# Patient Record
Sex: Male | Born: 1995 | Race: Black or African American | Hispanic: No | Marital: Single | Smoking: Current some day smoker
Health system: Southern US, Community
[De-identification: ages and names within clinical notes are randomized; demographics above are authoritative.]

## PROBLEM LIST (undated history)

## (undated) DIAGNOSIS — T7840XA Allergy, unspecified, initial encounter: Secondary | ICD-10-CM

## (undated) DIAGNOSIS — J45909 Unspecified asthma, uncomplicated: Secondary | ICD-10-CM

## (undated) HISTORY — DX: Unspecified asthma, uncomplicated: J45.909

## (undated) HISTORY — DX: Allergy, unspecified, initial encounter: T78.40XA

---
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
## 2015-10-20 ENCOUNTER — Ambulatory Visit (INDEPENDENT_AMBULATORY_CARE_PROVIDER_SITE_OTHER): Payer: Self-pay | Admitting: Urgent Care

## 2015-10-20 VITALS — BP 110/76 | HR 69 | Temp 97.6°F | Resp 16 | Ht 74.0 in | Wt 148.0 lb

## 2015-10-20 DIAGNOSIS — S41111A Laceration without foreign body of right upper arm, initial encounter: Secondary | ICD-10-CM

## 2015-10-20 DIAGNOSIS — F17200 Nicotine dependence, unspecified, uncomplicated: Secondary | ICD-10-CM

## 2015-10-20 DIAGNOSIS — Z23 Encounter for immunization: Secondary | ICD-10-CM

## 2015-10-20 MED ORDER — ALPRAZOLAM 0.25 MG PO TABS: 0.5000 mg | ORAL_TABLET | Freq: Once | ORAL | Status: AC

## 2015-10-20 NOTE — Patient Instructions (Addendum)
Laceration Care, Adult A laceration is a cut that goes through all of the layers of the skin and into the tissue that is right under the skin. Some lacerations heal on their own. Others need to be closed with stitches (sutures), staples, skin adhesive strips, or skin glue. Proper laceration care minimizes the risk of infection and helps the laceration to heal better. HOW TO CARE FOR YOUR LACERATION If sutures or staples were used:  Keep the wound clean and dry.  If you were given a bandage (dressing), you should change it at least one time per day or as told by your health care provider. You should also change it if it becomes wet or dirty.  Keep the wound completely dry for the first 24 hours or as told by your health care provider. After that time, you may shower or bathe. However, make sure that the wound is not soaked in water until after the sutures or staples have been removed.  Clean the wound one time each day or as told by your health care provider:  Wash the wound with soap and water.  Rinse the wound with water to remove all soap.  Pat the wound dry with a clean towel. Do not rub the wound.  After cleaning the wound, apply a thin layer of antibiotic ointmentas told by your health care provider. This will help to prevent infection and keep the dressing from sticking to the wound.  Have the sutures or staples removed as told by your health care provider. If skin adhesive strips were used:  Keep the wound clean and dry.  If you were given a bandage (dressing), you should change it at least one time per day or as told by your health care provider. You should also change it if it becomes dirty or wet.  Do not get the skin adhesive strips wet. You may shower or bathe, but be careful to keep the wound dry.  If the wound gets wet, pat it dry with a clean towel. Do not rub the wound.  Skin adhesive strips fall off on their own. You may trim the strips as the wound heals. Do not  remove skin adhesive strips that are still stuck to the wound. They will fall off in time. If skin glue was used:  Try to keep the wound dry, but you may briefly wet it in the shower or bath. Do not soak the wound in water, such as by swimming.  After you have showered or bathed, gently pat the wound dry with a clean towel. Do not rub the wound.  Do not do any activities that will make you sweat heavily until the skin glue has fallen off on its own.  Do not apply liquid, cream, or ointment medicine to the wound while the skin glue is in place. Using those may loosen the film before the wound has healed.  If you were given a bandage (dressing), you should change it at least one time per day or as told by your health care provider. You should also change it if it becomes dirty or wet.  If a dressing is placed over the wound, be careful not to apply tape directly over the skin glue. Doing that may cause the glue to be pulled off before the wound has healed.  Do not pick at the glue. The skin glue usually remains in place for 5-10 days, then it falls off of the skin. General Instructions  Take over-the-counter and prescription   medicines only as told by your health care provider.  If you were prescribed an antibiotic medicine or ointment, take or apply it as told by your doctor. Do not stop using it even if your condition improves.  To help prevent scarring, make sure to cover your wound with sunscreen whenever you are outside after stitches are removed, after adhesive strips are removed, or when glue remains in place and the wound is healed. Make sure to wear a sunscreen of at least 30 SPF.  Do not scratch or pick at the wound.  Keep all follow-up visits as told by your health care provider. This is important.  Check your wound every day for signs of infection. Watch for:  Redness, swelling, or pain.  Fluid, blood, or pus.  Raise (elevate) the injured area above the level of your heart  while you are sitting or lying down, if possible. SEEK MEDICAL CARE IF:  You received a tetanus shot and you have swelling, severe pain, redness, or bleeding at the injection site.  You have a fever.  A wound that was closed breaks open.  You notice a bad smell coming from your wound or your dressing.  You notice something coming out of the wound, such as wood or glass.  Your pain is not controlled with medicine.  You have increased redness, swelling, or pain at the site of your wound.  You have fluid, blood, or pus coming from your wound.  You notice a change in the color of your skin near your wound.  You need to change the dressing frequently due to fluid, blood, or pus draining from the wound.  You develop a new rash.  You develop numbness around the wound. SEEK IMMEDIATE MEDICAL CARE IF:  You develop severe swelling around the wound.  Your pain suddenly increases and is severe.  You develop painful lumps near the wound or on skin that is anywhere on your body.  You have a red streak going away from your wound.  The wound is on your hand or foot and you cannot properly move a finger or toe.  The wound is on your hand or foot and you notice that your fingers or toes look pale or bluish.   This information is not intended to replace advice given to you by your health care provider. Make sure you discuss any questions you have with your health care provider.   Document Released: 03/29/2005 Document Revised: 08/13/2014 Document Reviewed: 03/25/2014 Elsevier Interactive Patient Education 2016 [employer].     Varenicline oral tablets What is this medicine? VARENICLINE (var EN i kleen) is used to help people quit smoking. It can reduce the symptoms caused by stopping smoking. It is used with a patient support program recommended by your physician. This medicine may be used for other purposes; ask your health care provider or pharmacist if you have questions. What  should I tell my health care provider before I take this medicine? They need to know if you have any of these conditions: -bipolar disorder, depression, schizophrenia or other mental illness -heart disease -if you often drink alcohol -kidney disease -peripheral vascular disease -seizures -stroke -suicidal thoughts, plans, or attempt; a previous suicide attempt by you or a family member -an unusual or allergic reaction to varenicline, other medicines, foods, dyes, or preservatives -pregnant or trying to get pregnant -breast-feeding How should I use this medicine? Take this medicine by mouth after eating. Take with a full glass of water. Follow the directions  on the prescription label. Take your doses at regular intervals. Do not take your medicine more often than directed. There are 3 ways you can use this medicine to help you quit smoking; talk to your health care professional to decide which plan is right for you: 1) you can choose a quit date and start this medicine 1 week before the quit date, or, 2) you can start taking this medicine before you choose a quit date, and then pick a quit date between day 8 and 35 days of treatment, or, 3) if you are not sure that you are able or willing to quit smoking right away, start taking this medicine and slowly decrease the amount you smoke as directed by your health care professional with the goal of being cigarette-free by week 12 of treatment. Stick to your plan; ask about support groups or other ways to help you remain cigarette-free. If you are motivated to quit smoking and did not succeed during a previous attempt with this medicine for reasons other than side effects, or if you returned to smoking after this treatment, speak with your health care professional about whether another course of this medicine may be right for you. A special MedGuide will be given to you by the pharmacist with each prescription and refill. Be sure to read this  information carefully each time. Talk to your pediatrician regarding the use of this medicine in children. This medicine is not approved for use in children. Overdosage: If you think you have taken too much of this medicine contact a poison control center or emergency room at once. NOTE: This medicine is only for you. Do not share this medicine with others. What if I miss a dose? If you miss a dose, take it as soon as you can. If it is almost time for your next dose, take only that dose. Do not take double or extra doses. What may interact with this medicine? -alcohol or any product that contains alcohol -insulin -other stop smoking aids -theophylline -warfarin This list may not describe all possible interactions. Give your health care provider a list of all the medicines, herbs, non-prescription drugs, or dietary supplements you use. Also tell them if you smoke, drink alcohol, or use illegal drugs. Some items may interact with your medicine. What should I watch for while using this medicine? Visit your doctor or health care professional for regular check ups. Ask for ongoing advice and encouragement from your doctor or healthcare professional, friends, and family to help you quit. If you smoke while on this medication, quit again Your mouth may get dry. Chewing sugarless gum or sucking hard candy, and drinking plenty of water may help. Contact your doctor if the problem does not go away or is severe. You may get drowsy or dizzy. Do not drive, use machinery, or do anything that needs mental alertness until you know how this medicine affects you. Do not stand or sit up quickly, especially if you are an older patient. This reduces the risk of dizzy or fainting spells. Sleepwalking can happen during treatment with this medicine, and can sometimes lead to behavior that is harmful to you, other people, or property. Stop taking this medicine and tell your doctor if you start sleepwalking or have other  unusual sleep-related activity. Decrease the amount of alcoholic beverages that you drink during treatment with this medicine until you know if this medicine affects your ability to tolerate alcohol. Some people have experienced increased drunkenness (intoxication), unusual or sometimes  aggressive behavior, or no memory of things that have happened (amnesia) during treatment with this medicine. The use of this medicine may increase the chance of suicidal thoughts or actions. Pay special attention to how you are responding while on this medicine. Any worsening of mood, or thoughts of suicide or dying should be reported to your health care professional right away. What side effects may I notice from receiving this medicine? Side effects that you should report to your doctor or health care professional as soon as possible: -allergic reactions like skin rash, itching or hives, swelling of the face, lips, tongue, or throat -acting aggressive, being angry or violent, or acting on dangerous impulses -breathing problems -changes in vision -chest pain or chest tightness -confusion, trouble speaking or understanding -new or worsening depression, anxiety, or panic attacks -extreme increase in activity and talking (mania) -fast, irregular heartbeat -feeling faint or lightheaded, falls -fever -pain in legs when walking -problems with balance, talking, walking -redness, blistering, peeling or loosening of the skin, including inside the mouth -ringing in ears -seeing or hearing things that aren't there (hallucinations) -seizures -sleepwalking -sudden numbness or weakness of the face, arm or leg -thoughts about suicide or dying, or attempts to commit suicide -trouble passing urine or change in the amount of urine -unusual bleeding or bruising -unusually weak or tired Side effects that usually do not require medical attention (report to your doctor or health care professional if they continue or are  bothersome): -constipation -headache -nausea, vomiting -strange dreams -stomach gas -trouble sleeping This list may not describe all possible side effects. Call your doctor for medical advice about side effects. You may report side effects to FDA at 1-800-FDA-1088. Where should I keep my medicine? Keep out of the reach of children. Store at room temperature between 15 and 30 degrees C (59 and 86 degrees F). Throw away any unused medicine after the expiration date. NOTE: This sheet is a summary. It may not cover all possible information. If you have questions about this medicine, talk to your doctor, pharmacist, or health care provider.    2016, Elsevier/Gold Standard. (2014-12-12 16:14:23)     Smoking Cessation, Tips for Success If you are ready to quit smoking, congratulations! You have chosen to help yourself be healthier. Cigarettes bring nicotine, tar, carbon monoxide, and other irritants into your body. Your lungs, heart, and blood vessels will be able to work better without these poisons. There are many different ways to quit smoking. Nicotine gum, nicotine patches, a nicotine inhaler, or nicotine nasal spray can help with physical craving. Hypnosis, support groups, and medicines help break the habit of smoking. WHAT THINGS CAN I DO TO MAKE QUITTING EASIER?  Here are some tips to help you quit for good:  Pick a date when you will quit smoking completely. Tell all of your friends and family about your plan to quit on that date.  Do not try to slowly cut down on the number of cigarettes you are smoking. Pick a quit date and quit smoking completely starting on that day.  Throw away all cigarettes.   Clean and remove all ashtrays from your home, work, and car.  On a card, write down your reasons for quitting. Carry the card with you and read it when you get the urge to smoke.  Cleanse your body of nicotine. Drink enough water and fluids to keep your urine clear or pale yellow. Do  this after quitting to flush the nicotine from your body.  Learn to  predict your moods. Do not let a bad situation be your excuse to have a cigarette. Some situations in your life might tempt you into wanting a cigarette.  Never have "just one" cigarette. It leads to wanting another and another. Remind yourself of your decision to quit.  Change habits associated with smoking. If you smoked while driving or when feeling stressed, try other activities to replace smoking. Stand up when drinking your coffee. Brush your teeth after eating. Sit in a different chair when you read the paper. Avoid alcohol while trying to quit, and try to drink fewer caffeinated beverages. Alcohol and caffeine may urge you to smoke.  Avoid foods and drinks that can trigger a desire to smoke, such as sugary or spicy foods and alcohol.  Ask people who smoke not to smoke around you.  Have something planned to do right after eating or having a cup of coffee. For example, plan to take a walk or exercise.  Try a relaxation exercise to calm you down and decrease your stress. Remember, you may be tense and nervous for the first 2 weeks after you quit, but this will pass.  Find new activities to keep your hands busy. Play with a pen, coin, or rubber band. Doodle or draw things on paper.  Brush your teeth right after eating. This will help cut down on the craving for the taste of tobacco after meals. You can also try mouthwash.   Use oral substitutes in place of cigarettes. Try using lemon drops, carrots, cinnamon sticks, or chewing gum. Keep them handy so they are available when you have the urge to smoke.  When you have the urge to smoke, try deep breathing.  Designate your home as a nonsmoking area.  If you are a heavy smoker, ask your health care provider about a prescription for nicotine chewing gum. It can ease your withdrawal from nicotine.  Reward yourself. Set aside the cigarette money you save and buy yourself  something nice.  Look for support from others. Join a support group or smoking cessation program. Ask someone at home or at work to help you with your plan to quit smoking.  Always ask yourself, "Do I need this cigarette or is this just a reflex?" Tell yourself, "Today, I choose not to smoke," or "I do not want to smoke." You are reminding yourself of your decision to quit.  Do not replace cigarette smoking with electronic cigarettes (commonly called e-cigarettes). The safety of e-cigarettes is unknown, and some may contain harmful chemicals.  If you relapse, do not give up! Plan ahead and think about what you will do the next time you get the urge to smoke. HOW WILL I FEEL WHEN I QUIT SMOKING? You may have symptoms of withdrawal because your body is used to nicotine (the addictive substance in cigarettes). You may crave cigarettes, be irritable, feel very hungry, cough often, get headaches, or have difficulty concentrating. The withdrawal symptoms are only temporary. They are strongest when you first quit but will go away within 10-14 days. When withdrawal symptoms occur, stay in control. Think about your reasons for quitting. Remind yourself that these are signs that your body is healing and getting used to being without cigarettes. Remember that withdrawal symptoms are easier to treat than the major diseases that smoking can cause.  Even after the withdrawal is over, expect periodic urges to smoke. However, these cravings are generally short lived and will go away whether you smoke or not. Do not  smoke! WHAT RESOURCES ARE AVAILABLE TO HELP ME QUIT SMOKING? Your health care provider can direct you to community resources or hospitals for support, which may include:  Group support.  Education.  Hypnosis.  Therapy.   This information is not intended to replace advice given to you by your health care provider. Make sure you discuss any questions you have with your health care provider.    Document Released: 12/26/2003 Document Revised: 04/19/2014 Document Reviewed: 09/14/2012 Elsevier Interactive Patient Education 2016 [employer].     IF you received an x-ray today, you will receive an invoice from [facility]. Please contact [facility] at [phone] with questions or concerns regarding your invoice.   IF you received labwork today, you will receive an invoice from [facility]. Please contact [facility] at [phone] with questions or concerns regarding your invoice.   Our billing staff will not be able to assist you with questions regarding bills from these companies.  You will be contacted with the lab results as soon as they are available. The fastest way to get your results is to activate your My Chart account. Instructions are located on the last page of this paperwork. If you have not heard from [place] regarding the results in 2 weeks, please contact this office.

## 2015-10-20 NOTE — Progress Notes (Signed)
    MRN: [id number] DOB: [birth year]  Subjective:   Luis Solomon is a 20 y.o. male presenting for chief complaint of Arm Injury  Reports suffering a laceration to his right bicep today while trying to lift a dishwasher. Patient did not feel pain immediately but did see profuse bleeding. He wrapped up his arm in gauze and came to our clinic immediately. Currently his pain level is 4/10. Denies loss of ROM, numbness or tingling, swelling. Smokes 1ppd.   Luis Solomon currently has no medications in their medication list. Also is allergic to pollen extract.  Luis Solomon  has a past medical history of Allergy and Asthma. Also  has no past surgical history on file.  Denies family history of cancer, diabetes, HTN, HL, heart disease, stroke, mental illness.  Objective:   Vitals: BP 110/76 mmHg  Pulse 69  Temp(Src) 97.6 F (36.4 C) (Oral)  Resp 16  Ht 6\' 2"  (1.88 m)  Wt 148 lb (67.132 kg)  BMI 18.99 kg/m2  SpO2 99%  Physical Exam  Constitutional: He is oriented to person, place, and time. He appears well-developed and well-nourished.  Cardiovascular: Normal rate.   Pulmonary/Chest: Effort normal.  Neurological: He is alert and oriented to person, place, and time.  Skin: Skin is warm and dry.      PROCEDURE NOTE: laceration repair Verbal consent obtained from patient.  Local anesthesia with 5cc Lidocaine 1% with epinephrine.  Wound explored for tendon, ligament damage. Wound scrubbed with soap and water and rinsed. Wound closed with #10 4-0 Prolene (1 horizontal mattress and ~9 simple interrupted) sutures.  Wound cleansed and dressed.  Assessment and Plan :   1. Laceration of right upper arm, initial encounter - Laceration repaired. Patient to rtc in 3 days for removal of central horizontal mattress suture. RTC in 10 days thereafter for removal of the rest of the sutures.   2. Need for Tdap vaccination - Tdap vaccine greater than or equal to 7yo IM  3. Tobacco use disorder -  Counseled on smoking cessation. Patient will consider Chantix  Wallis BambergMario Saharsh Sterling, PA-C [facility] [phone] 10/20/2015 9:00 AM

## 2015-10-21 ENCOUNTER — Telehealth: Payer: Self-pay

## 2015-10-21 NOTE — Telephone Encounter (Signed)
Pt had stitches from yesterday and is needing something for pain on the laceration  Of his arm  Best [phone]

## 2015-10-22 MED ORDER — NAPROXEN SODIUM 550 MG PO TABS: 550.0000 mg | ORAL_TABLET | Freq: Two times a day (BID) | ORAL | Status: DC

## 2015-10-22 NOTE — Telephone Encounter (Signed)
Sent a script for Anaprox to be taken twice daily.

## 2015-10-22 NOTE — Telephone Encounter (Signed)
Pt's mom advised -

## 2015-10-23 ENCOUNTER — Ambulatory Visit (INDEPENDENT_AMBULATORY_CARE_PROVIDER_SITE_OTHER): Payer: Self-pay | Admitting: Urgent Care

## 2015-10-23 ENCOUNTER — Telehealth: Payer: Self-pay

## 2015-10-23 VITALS — BP 96/70 | HR 54 | Temp 98.1°F | Resp 16 | Ht 74.0 in | Wt 143.2 lb

## 2015-10-23 DIAGNOSIS — S41111A Laceration without foreign body of right upper arm, initial encounter: Secondary | ICD-10-CM

## 2015-10-23 NOTE — Patient Instructions (Addendum)
     IF you received an x-ray today, you will receive an invoice from [facility]. Please contact [facility] at [phone] with questions or concerns regarding your invoice.   IF you received labwork today, you will receive an invoice from [facility]. Please contact [facility] at [phone] with questions or concerns regarding your invoice.   Our billing staff will not be able to assist you with questions regarding bills from these companies.  You will be contacted with the lab results as soon as they are available. The fastest way to get your results is to activate your My Chart account. Instructions are located on the last page of this paperwork. If you have not heard from us regarding the results in 2 weeks, please contact this office.      

## 2015-10-23 NOTE — Telephone Encounter (Signed)
Ok - ready to pick up

## 2015-10-23 NOTE — Progress Notes (Signed)
    MRN: [id number] DOB: February 06, 1996  Subjective:   Luis Solomon is a 20 y.o. male presenting for follow up on laceration.   He is presenting today as discussed at his last visit for removal of middle horizontal mattress suture. He is doing very well, denies fever, redness, pain, drainage of pus or bleeding.  Luis Solomon has a current medication list which includes the following prescription(s): naproxen sodium, and the following Facility-Administered Medications: alprazolam. Also is allergic to pollen extract.  Luis Solomon  has a past medical history of Allergy and Asthma. Also  has no past surgical history on file.  Objective:   Vitals: BP 96/70 mmHg  Pulse 54  Temp(Src) 98.1 F (36.7 C) (Oral)  Resp 16  Ht 6\' 2"  (1.88 m)  Wt 143 lb 3.2 oz (64.955 kg)  BMI 18.38 kg/m2  SpO2 98%  Physical Exam  Skin:       Suture Removal - 1 horizontal mattress suture removed without incident, patient tolerated this well.  Assessment and Plan :     Luis BambergMario Anagha Loseke, PA-C [facility] [phone] 10/23/2015 9:43 AM

## 2015-10-23 NOTE — Telephone Encounter (Signed)
Patient had a stitch  today.  He needs a work note for [employer] for all day to day.   He will stop by at 8 am in the morning to pick it up.  [phone]

## 2015-10-27 ENCOUNTER — Telehealth: Payer: Self-pay

## 2015-10-27 NOTE — Telephone Encounter (Signed)
You can tell him to use otc naproxen 400mg  twice daily with food. Thank you!

## 2015-10-27 NOTE — Telephone Encounter (Signed)
PA for Medicaid requested for Naproxen Sodium DS 550mg  #30 Sig 1 tab twice daily with meals. Pt reported not taking 7/13 OV

## 2015-10-29 NOTE — Telephone Encounter (Signed)
LMOM for pt or mother at mother's home # in demographics to CB. Pt's number in EPIC is not a working #. Please update ph #s for pt and give Mani's instr'd below.

## 2015-10-31 NOTE — Telephone Encounter (Signed)
I have never gotten a CB from mother or pt. Left detailed message on mother's VM with directions for taking OTC naproxen below. Asked for CB if there are any questions.

## 2016-04-25 ENCOUNTER — Encounter (HOSPITAL_COMMUNITY): Payer: Self-pay | Admitting: *Deleted

## 2016-04-25 ENCOUNTER — Emergency Department (HOSPITAL_COMMUNITY)
Admission: EM | Admit: 2016-04-25 | Discharge: 2016-04-25 | Disposition: A | Discharge status: Home / Self Care | Payer: Medicaid Other | Attending: Emergency Medicine | Admitting: Emergency Medicine

## 2016-04-25 DIAGNOSIS — J45909 Unspecified asthma, uncomplicated: Secondary | ICD-10-CM | POA: Insufficient documentation

## 2016-04-25 DIAGNOSIS — J029 Acute pharyngitis, unspecified: Secondary | ICD-10-CM | POA: Diagnosis present

## 2016-04-25 DIAGNOSIS — F17200 Nicotine dependence, unspecified, uncomplicated: Secondary | ICD-10-CM | POA: Diagnosis not present

## 2016-04-25 DIAGNOSIS — J111 Influenza due to unidentified influenza virus with other respiratory manifestations: Secondary | ICD-10-CM | POA: Diagnosis not present

## 2016-04-25 DIAGNOSIS — R69 Illness, unspecified: Secondary | ICD-10-CM

## 2016-04-25 LAB — I-STAT CHEM 8, ED
BUN: 13 mg/dL (ref 6–20)
CALCIUM ION: 1.13 mmol/L — AB (ref 1.15–1.40)
CHLORIDE: 104 mmol/L (ref 101–111)
Creatinine, Ser: 1 mg/dL (ref 0.61–1.24)
Glucose, Bld: 99 mg/dL (ref 65–99)
HCT: 46 % (ref 39.0–52.0)
Hemoglobin: 15.6 g/dL (ref 13.0–17.0)
Potassium: 3.9 mmol/L (ref 3.5–5.1)
SODIUM: 142 mmol/L (ref 135–145)
TCO2: 27 mmol/L (ref 0–100)

## 2016-04-25 MED ORDER — ONDANSETRON HCL 4 MG/2ML IJ SOLN: 4.0000 mg | Freq: Once | INTRAMUSCULAR | Status: DC

## 2016-04-25 MED ORDER — KETOROLAC TROMETHAMINE 30 MG/ML IJ SOLN: 30.0000 mg | Freq: Once | INTRAMUSCULAR | Status: DC

## 2016-04-25 MED ORDER — SODIUM CHLORIDE 0.9 % IV BOLUS (SEPSIS): 1000.0000 mL | Freq: Once | INTRAVENOUS | Status: DC

## 2016-04-25 MED ORDER — ONDANSETRON 4 MG PO TBDP
4.0000 mg | ORAL_TABLET | Freq: Once | ORAL | Status: AC
  Administered 2016-04-25: 4 mg via ORAL
  Filled 2016-04-25: qty 1

## 2016-04-25 MED ORDER — IBUPROFEN 800 MG PO TABS
800.0000 mg | ORAL_TABLET | Freq: Once | ORAL | Status: AC
  Administered 2016-04-25: 800 mg via ORAL
  Filled 2016-04-25: qty 1

## 2016-04-25 MED ORDER — ONDANSETRON HCL 4 MG PO TABS: 4.0000 mg | ORAL_TABLET | Freq: Four times a day (QID) | ORAL | 0 refills | Status: DC

## 2016-04-25 NOTE — ED Triage Notes (Signed)
Pt reports possibly having flu. Has bodyaches, chills, headache, cough, sore throat and n/v/d. Mask on pt at triage.

## 2016-04-25 NOTE — ED Provider Notes (Signed)
[facility]-EMERGENCY DEPT Provider Note   CSN: [id number] Arrival date & time: 04/25/16  0725   History   Chief Complaint Chief Complaint  Patient presents with  . Influenza    HPI Luis Solomon is a 21 y.o. male.  HPI    Patient to the ER with PMH of asthma and allergies complaining of multiple symptoms of body aches, sore throat, nausea, vomiting, diarrhea, weakness. He denies that he has been coughing, no ear pain. His mom brought him in because he got up to go to work and became dizzy and almost fell over. He has been eating and drinking normal. He has not had headache, back pain, lower extremity weakness, neck pain, fevers.  Past Medical History:  Diagnosis Date  . Allergy   . Asthma     There are no active problems to display for this patient.   History reviewed. No pertinent surgical history.     Home Medications    Prior to Admission medications   Medication Sig Start Date End Date Taking? Authorizing Provider  naproxen sodium (ANAPROX DS) 550 MG tablet Take 1 tablet (550 mg total) by mouth 2 (two) times daily with a meal. Patient not taking: Reported on 10/23/2015 10/22/15   Wallis BambergMario Mani, PA-C  ondansetron (ZOFRAN) 4 MG tablet Take 1 tablet (4 mg total) by mouth every 6 (six) hours. 04/25/16   Marlon Peliffany Mairely Foxworth, PA-C    Family History History reviewed. No pertinent family history.  Social History Social History  Substance Use Topics  . Smoking status: Heavy Tobacco Smoker  . Smokeless tobacco: Never Used  . Alcohol use 0.0 oz/week     Allergies   Pollen extract   Review of Systems Review of Systems  Review of Systems All other systems negative except as documented in the HPI. All pertinent positives and negatives as reviewed in the HPI.  Physical Exam Updated Vital Signs BP 133/73 (BP Location: Right Arm)   Pulse 68   Temp 98.1 F (36.7 C) (Oral)   Resp 12   Ht 6\' 3"  (1.905 m)   Wt 66.4 kg   SpO2 98%   BMI 18.29 kg/m   Physical Exam    Constitutional: He appears well-developed and well-nourished. No distress.  HENT:  Head: Normocephalic and atraumatic.  Right Ear: Tympanic membrane and ear canal normal.  Left Ear: Tympanic membrane and ear canal normal.  Nose: Nose normal.  Mouth/Throat: Uvula is midline, oropharynx is clear and moist and mucous membranes are normal.  Eyes: Pupils are equal, round, and reactive to light.  Neck: Normal range of motion. Neck supple.  Cardiovascular: Normal rate and regular rhythm.   Pulmonary/Chest: Effort normal.  Abdominal: Soft.  No signs of abdominal distention  Musculoskeletal:  No LE swelling  Neurological: He is alert.  Acting at baseline  Skin: Skin is warm and dry. No rash noted.  Nursing note and vitals reviewed.   ED Treatments / Results  Labs (all labs ordered are listed, but only abnormal results are displayed) Labs Reviewed  I-STAT CHEM 8, ED - Abnormal; Notable for the following:       Result Value   Calcium, Ion 1.13 (*)    All other components within normal limits    EKG  EKG Interpretation None       Radiology No results found.  Procedures Procedures (including critical care time)  Medications Ordered in ED Medications  ondansetron (ZOFRAN-ODT) disintegrating tablet 4 mg (4 mg Oral Given 04/25/16 0851)  ibuprofen (ADVIL,MOTRIN)  tablet 800 mg (800 mg Oral Given 04/25/16 0851)     Initial Impression / Assessment and Plan / ED Course  I have reviewed the triage vital signs and the nursing notes.  Pertinent labs & imaging results that were available during my care of the patient were reviewed by me and considered in my medical decision making (see chart for details).  Clinical Course     Pt refuses IV, he says that he is incredibly scared of needles. And refuses x 3. His labwork is reassuring and well as his vital signs. He has tolerated PO in the ED without difficulty. Pt agreed to orally hydrate better. rx Zofran for home. Cont Motrin and  Tylenol for aches and pain. Discussed return precautions. No coughing and no fevers.  Final Clinical Impressions(s) / ED Diagnoses   Final diagnoses:  Influenza-like illness    New Prescriptions New Prescriptions   ONDANSETRON (ZOFRAN) 4 MG TABLET    Take 1 tablet (4 mg total) by mouth every 6 (six) hours.     Marlon Pel, PA-C 04/25/16 1610    Gerhard Munch, MD 04/25/16 [phone]

## 2016-04-25 NOTE — ED Notes (Signed)
Stuck pt for iv got blood and then it would not thread. Pt states he will drink water

## 2016-04-25 NOTE — ED Notes (Addendum)
Body aches and vomiting x 3 days took motrin and tylenol at 530 this am, pt has cough

## 2018-03-12 ENCOUNTER — Encounter (HOSPITAL_COMMUNITY): Payer: Self-pay

## 2018-03-12 ENCOUNTER — Emergency Department (HOSPITAL_COMMUNITY)
Admission: EM | Admit: 2018-03-12 | Discharge: 2018-03-12 | Disposition: A | Discharge status: Home / Self Care | Payer: Self-pay | Attending: Emergency Medicine | Admitting: Emergency Medicine

## 2018-03-12 ENCOUNTER — Emergency Department (HOSPITAL_COMMUNITY): Payer: Self-pay

## 2018-03-12 DIAGNOSIS — F17200 Nicotine dependence, unspecified, uncomplicated: Secondary | ICD-10-CM | POA: Insufficient documentation

## 2018-03-12 DIAGNOSIS — R042 Hemoptysis: Secondary | ICD-10-CM | POA: Insufficient documentation

## 2018-03-12 DIAGNOSIS — J40 Bronchitis, not specified as acute or chronic: Secondary | ICD-10-CM | POA: Insufficient documentation

## 2018-03-12 DIAGNOSIS — R0789 Other chest pain: Secondary | ICD-10-CM | POA: Insufficient documentation

## 2018-03-12 MED ORDER — PREDNISONE 10 MG PO TABS: 40.0000 mg | ORAL_TABLET | Freq: Every day | ORAL | 0 refills | Status: AC

## 2018-03-12 MED ORDER — ALBUTEROL SULFATE HFA 108 (90 BASE) MCG/ACT IN AERS: 1.0000 | INHALATION_SPRAY | Freq: Four times a day (QID) | RESPIRATORY_TRACT | 0 refills | Status: DC | PRN

## 2018-03-12 MED ORDER — IPRATROPIUM-ALBUTEROL 0.5-2.5 (3) MG/3ML IN SOLN
3.0000 mL | Freq: Once | RESPIRATORY_TRACT | Status: AC
  Administered 2018-03-12: 3 mL via RESPIRATORY_TRACT
  Filled 2018-03-12: qty 3

## 2018-03-12 NOTE — ED Triage Notes (Signed)
Patient c/o productive cough for 2 weeks that has turned into brown with blood spots in it. Patient noticed the blood yesterday morning. Patient c/o tightness/soreness in chest from choughing Patient denies shob, n/v, diarrhea.  Hx. Asthma   Patient states once every 3 months patient has dizzy spells.   A/OX4

## 2018-03-12 NOTE — ED Notes (Signed)
ED Provider at bedside. 

## 2018-03-12 NOTE — Discharge Instructions (Signed)
We believe your persistent cough is a result of a viral syndrome for which your symptoms have still not quite resolved.  Sometimes it takes many weeks to completely go away, especially if you smoke or have chronic lung problems.  Please take any medications prescribed and follow up as recommended with your regular doctor.  If you develop any new or worsening symptoms, including but not limited to fever, persistent vomiting, worsening shortness of breath, chest pain or other symptoms that concern you, please return to the Emergency Department immediately.

## 2018-03-12 NOTE — ED Provider Notes (Signed)
Emergency Department Provider Note   I have reviewed the triage vital signs and the nursing notes.   HISTORY  Chief Complaint Hemoptysis   HPI Luis Solomon is a 22 y.o. male with PMH of asthma resents to the emergency department for evaluation of productive cough, mild dyspnea, mild chest tightness.  Patient has had symptoms for several days and initially was having productive cough with some brown mucus.  Starting yesterday he began to seize specks of what appeared to be blood in the mucus.  No large volume hemoptysis.  No vomiting blood.  Patient denies any increased shortness of breath from his baseline.  He states that with asthma flares he often has chest tightness.  He occasionally feels lightheaded but nothing recently.  He has not been using his albuterol inhalers at home for the symptoms.  He has no history of DVT/PE.  No recent trips, prolonged inactivity, or surgery.  He does not have risk factors for tuberculosis.   Past Medical History:  Diagnosis Date  . Allergy   . Asthma     There are no active problems to display for this patient.   History reviewed. No pertinent surgical history.  Allergies Pollen extract  History reviewed. No pertinent family history.  Social History Social History   Tobacco Use  . Smoking status: Heavy Tobacco Smoker  . Smokeless tobacco: Never Used  Substance Use Topics  . Alcohol use: Yes    Alcohol/week: 0.0 standard drinks  . Drug use: No    Review of Systems  Constitutional: No fever/chills Eyes: No visual changes. ENT: No sore throat. Cardiovascular: Positive chest tightness.  Respiratory: Positive shortness of breath and wheezing. Positive hemoptysis.  Gastrointestinal: No abdominal pain.  No nausea, no vomiting.  No diarrhea.  No constipation. Genitourinary: Negative for dysuria. Musculoskeletal: Negative for back pain. Skin: Negative for rash. Neurological: Negative for headaches, focal weakness or  numbness.  10-point ROS otherwise negative.  ____________________________________________   PHYSICAL EXAM:  VITAL SIGNS: ED Triage Vitals  Enc Vitals Group     BP 03/12/18 1023 126/79     Pulse Rate 03/12/18 1023 61     Resp 03/12/18 1023 18     Temp 03/12/18 1023 98.1 F (36.7 C)     Temp Source 03/12/18 1023 Oral     SpO2 03/12/18 1023 97 %     Weight 03/12/18 1023 145 lb (65.8 kg)     Height 03/12/18 1023 6\' 3"  (1.905 m)     Pain Score 03/12/18 1028 0   Constitutional: Alert and oriented. Well appearing and in no acute distress. Eyes: Conjunctivae are normal. Head: Atraumatic. Nose: No congestion/rhinnorhea. Mouth/Throat: Mucous membranes are moist. Neck: No stridor.  Cardiovascular: Normal rate, regular rhythm. Good peripheral circulation. Grossly normal heart sounds.   Respiratory: Normal respiratory effort.  No retractions. Lungs with trace end-expiratory wheezing on forced expiration.  Gastrointestinal: Soft and nontender. No distention.  Musculoskeletal: No lower extremity tenderness nor edema. No gross deformities of extremities. Neurologic:  Normal speech and language. No gross focal neurologic deficits are appreciated.  Skin:  Skin is warm, dry and intact. No rash noted.  ____________________________________________  EKG   EKG Interpretation  Date/Time:  Sunday March 12 2018 11:47:01 EST Ventricular Rate:  54 PR Interval:    QRS Duration: 104 QT Interval:  400 QTC Calculation: 379 R Axis:   71 Text Interpretation:  Sinus rhythm ST elev, probable normal early repol pattern No STEMI.  Confirmed by Alona BeneLong,  (  [id number]) on 03/12/2018 11:57:41 AM Also confirmed by Alona Bene [phone]), editor Barbette Hair [phone])  on 03/12/2018 1:03:04 PM       ____________________________________________  RADIOLOGY  Dg Chest 2 View  Result Date: 03/12/2018 CLINICAL DATA:  Dyspnea EXAM: CHEST - 2 VIEW COMPARISON:  09/06/2006 chest radiograph. FINDINGS: Stable  cardiomediastinal silhouette with normal heart size. No pneumothorax. No pleural effusion. Lungs appear clear, with no acute consolidative airspace disease and no pulmonary edema. IMPRESSION: No active cardiopulmonary disease. Electronically Signed   By: Delbert Phenix M.D.   On: 03/12/2018 12:25    ____________________________________________   PROCEDURES  Procedure(s) performed:   Procedures  None ____________________________________________   INITIAL IMPRESSION / ASSESSMENT AND PLAN / ED COURSE  Pertinent labs & imaging results that were available during my care of the patient were reviewed by me and considered in my medical decision making (see chart for details).  Patient presents to the emergency department for evaluation of wheezing, chest tightness, and very mild hemoptysis.  Patient has no TB or PE risk factors.  He does have asthma.  Clinically seems most consistent with hemoptysis related to mild to moderate bronchitis.  Plan for chest x-ray to rule out infiltrate.  Will obtain EKG to assess for any right heart strain.  Patient has no tachycardia or hypoxemia.  Plan for nebulizer and reassessment afterwards.  CXR and EKG normal. Plan for bronchitis treatment and PCP follow up. Discussed ED return precautions with the patient and mom at bedside.  ____________________________________________  FINAL CLINICAL IMPRESSION(S) / ED DIAGNOSES  Final diagnoses:  Bronchitis  Hemoptysis     MEDICATIONS GIVEN DURING THIS VISIT:  Medications  ipratropium-albuterol (DUONEB) 0.5-2.5 (3) MG/3ML nebulizer solution 3 mL (3 mLs Nebulization Given 03/12/18 1134)     NEW OUTPATIENT MEDICATIONS STARTED DURING THIS VISIT:  Discharge Medication List as of 03/12/2018 12:30 PM    START taking these medications   Details  albuterol (PROVENTIL HFA;VENTOLIN HFA) 108 (90 Base) MCG/ACT inhaler Inhale 1-2 puffs into the lungs every 6 (six) hours as needed for wheezing or shortness of breath.,  Starting Sun 03/12/2018, Print    predniSONE (DELTASONE) 10 MG tablet Take 4 tablets (40 mg total) by mouth daily for 5 days., Starting Sun 03/12/2018, Until Fri 03/17/2018, Print        Note:  This document was prepared using Dragon voice recognition software and may include unintentional dictation errors.  Alona Bene, MD Emergency Medicine    , Arlyss Repress, MD 03/12/18 2039

## 2018-03-12 NOTE — ED Notes (Signed)
Patient transported to X-ray 

## 2018-03-12 NOTE — ED Notes (Signed)
EKG has been given to ED Provider.

## 2020-06-29 IMAGING — CR DG CHEST 2V
2 series · 2 of 2 positions shown · non-contrast
Comparison: 09/06/2006 chest radiograph.

CLINICAL DATA: Dyspnea

EXAM:
CHEST - 2 VIEW

[w chest pa]
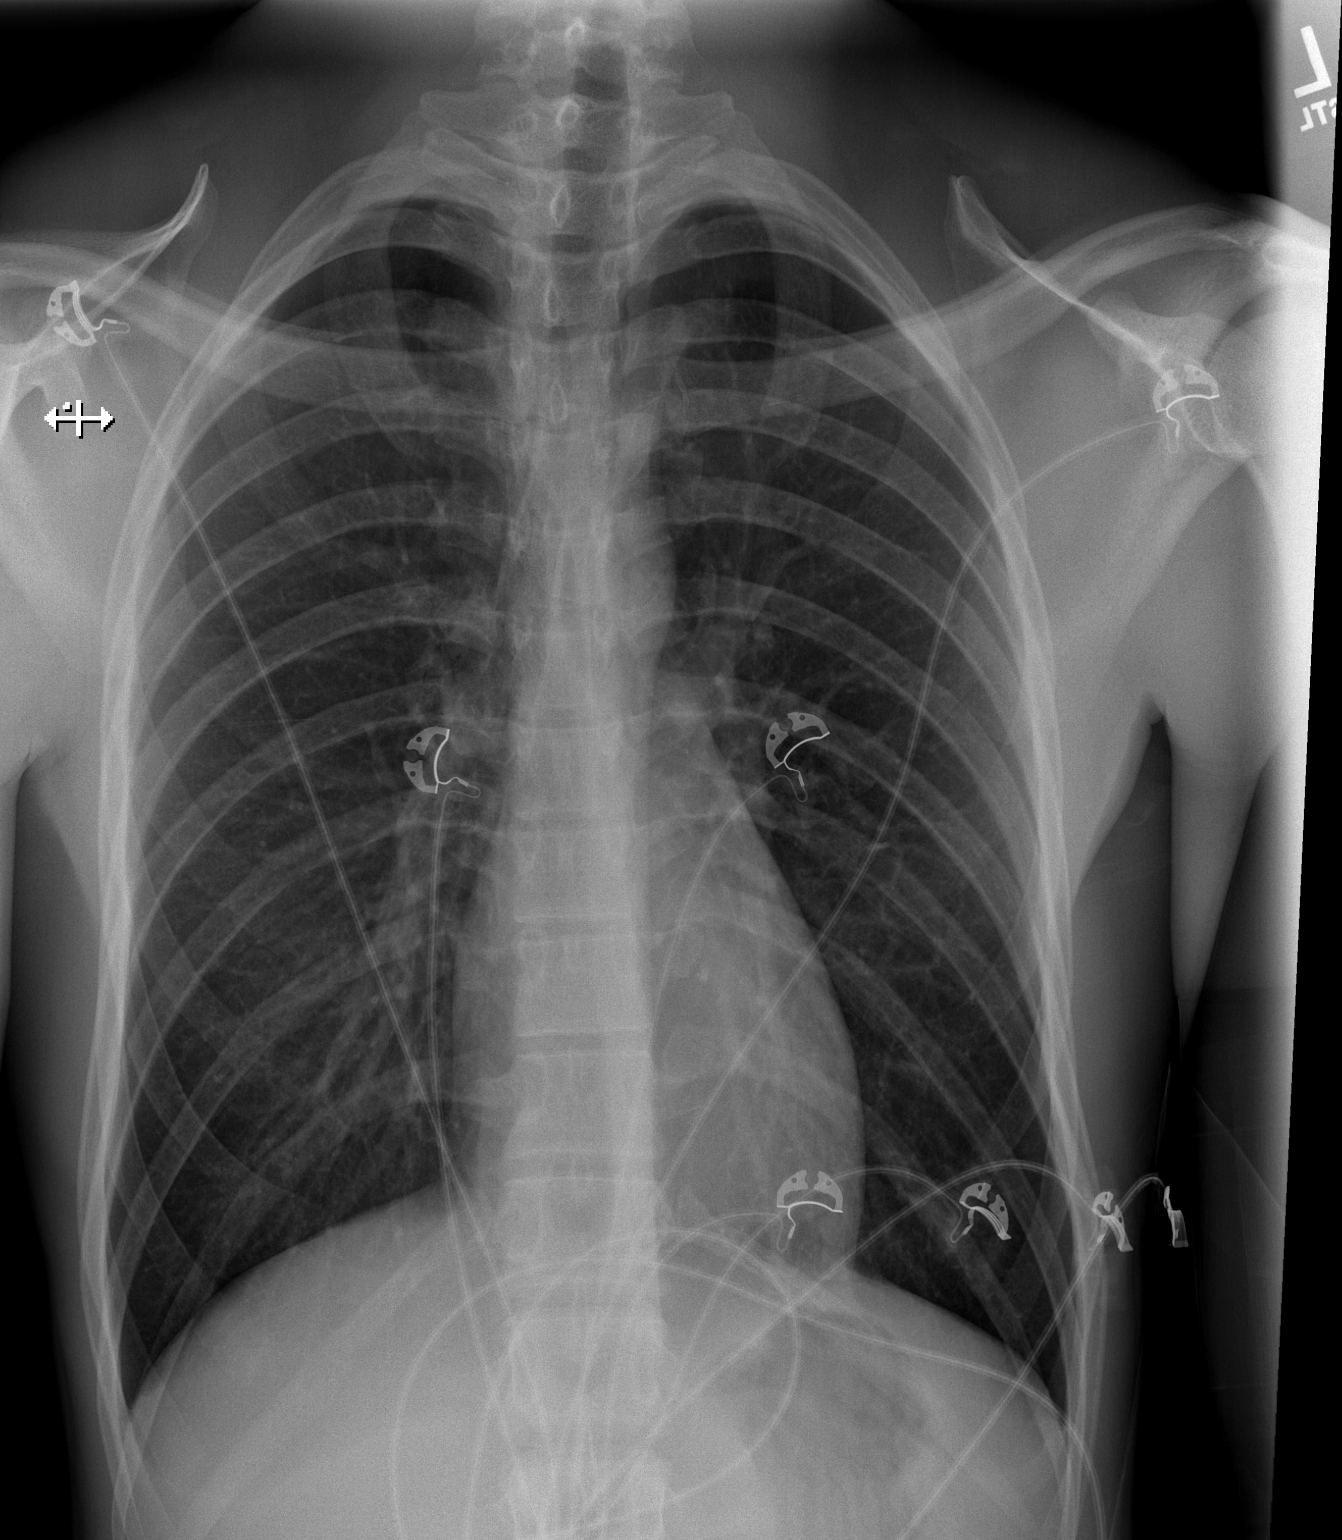

[w chest lat]
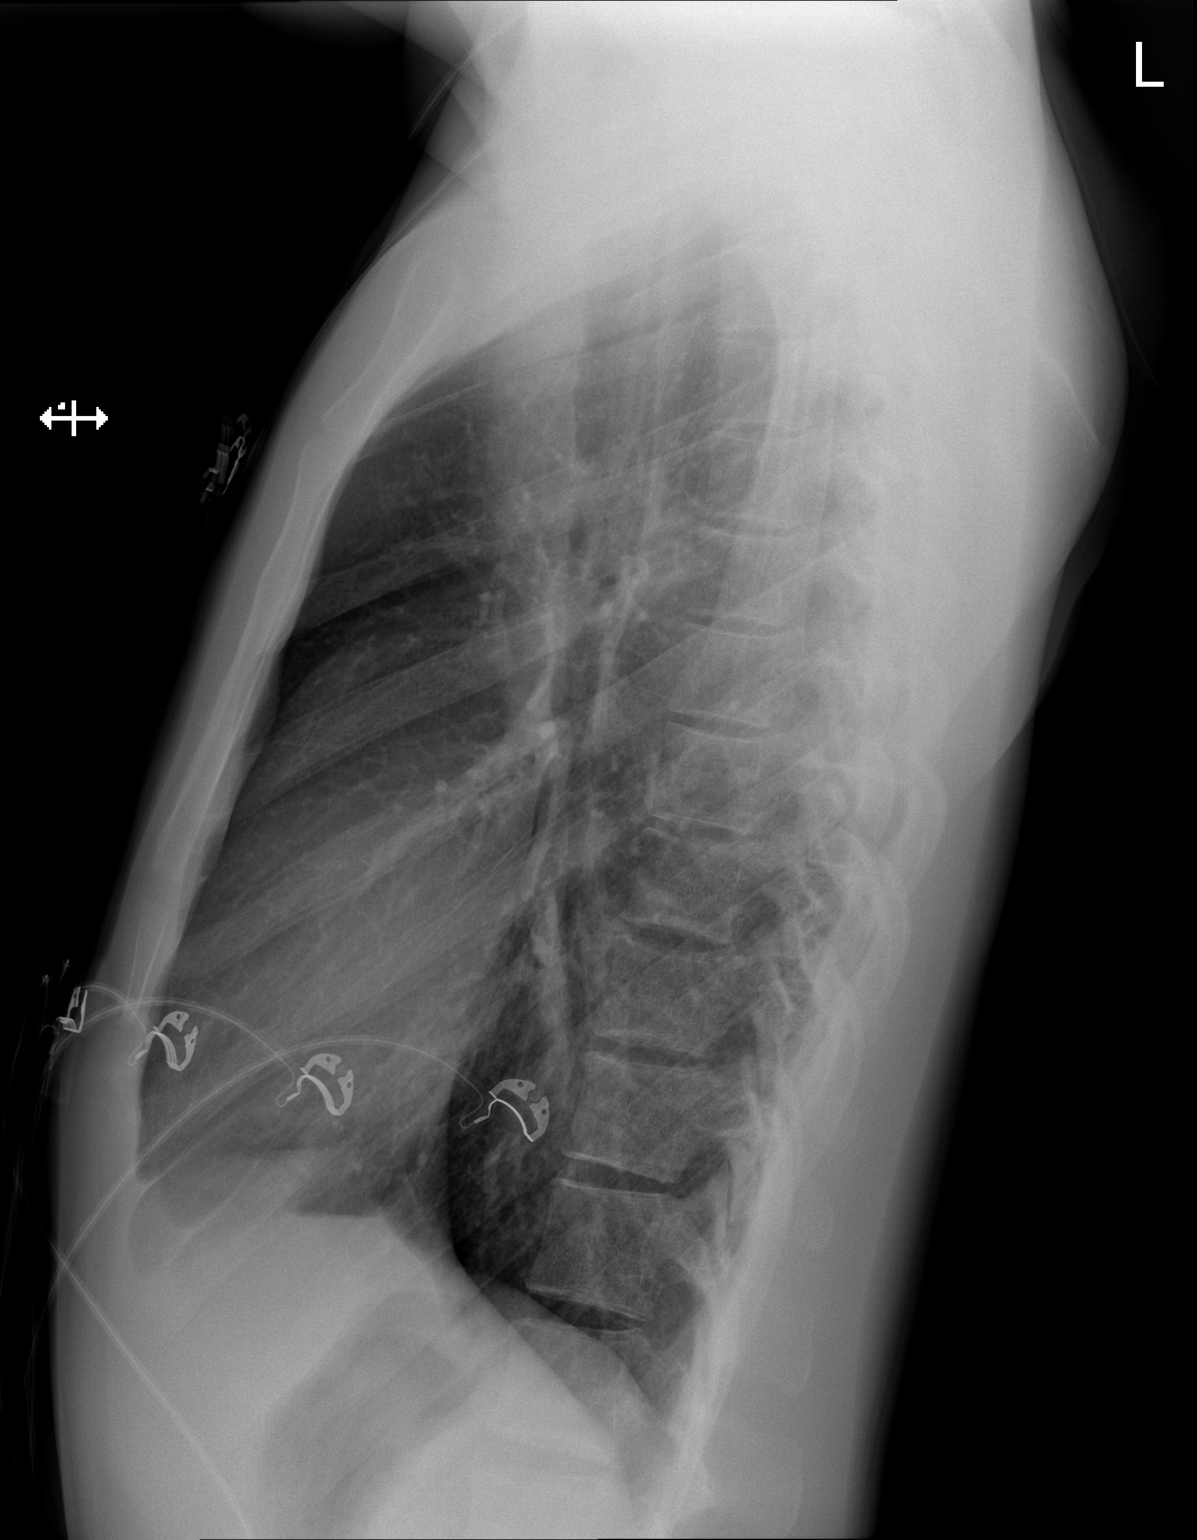

[2 of 2 positions shown; findings below may reference images not displayed]

FINDINGS: Stable cardiomediastinal silhouette with normal heart size. No
pneumothorax. No pleural effusion. Lungs appear clear, with no acute
consolidative airspace disease and no pulmonary edema.
IMPRESSION: No active cardiopulmonary disease.

## 2020-12-26 ENCOUNTER — Emergency Department (HOSPITAL_COMMUNITY)
Admission: EM | Admit: 2020-12-26 | Discharge: 2020-12-26 | Disposition: A | Discharge status: Home / Self Care | Payer: Self-pay | Attending: Emergency Medicine | Admitting: Emergency Medicine

## 2020-12-26 ENCOUNTER — Other Ambulatory Visit: Payer: Self-pay

## 2020-12-26 ENCOUNTER — Encounter (HOSPITAL_COMMUNITY): Payer: Self-pay

## 2020-12-26 DIAGNOSIS — U071 COVID-19: Secondary | ICD-10-CM | POA: Insufficient documentation

## 2020-12-26 DIAGNOSIS — J45909 Unspecified asthma, uncomplicated: Secondary | ICD-10-CM | POA: Insufficient documentation

## 2020-12-26 DIAGNOSIS — Z28310 Unvaccinated for covid-19: Secondary | ICD-10-CM | POA: Insufficient documentation

## 2020-12-26 DIAGNOSIS — F17200 Nicotine dependence, unspecified, uncomplicated: Secondary | ICD-10-CM | POA: Insufficient documentation

## 2020-12-26 LAB — RESP PANEL BY RT-PCR (FLU A&B, COVID) ARPGX2
Influenza A by PCR: NEGATIVE
Influenza B by PCR: NEGATIVE
SARS Coronavirus 2 by RT PCR: POSITIVE — AB

## 2020-12-26 MED ORDER — BENZONATATE 100 MG PO CAPS: 100.0000 mg | ORAL_CAPSULE | Freq: Three times a day (TID) | ORAL | 0 refills | Status: DC | PRN

## 2020-12-26 NOTE — ED Triage Notes (Signed)
Pt arrived via POV, c/o chills and body aches x2 days. No known sick contacts.

## 2020-12-26 NOTE — ED Provider Notes (Signed)
[facility]-EMERGENCY DEPT Provider Note   CSN: [id number] Arrival date & time: 12/26/20  9937     History Chief Complaint  Patient presents with   Chills    Luis Solomon is a 25 y.o. male with past medical history of asthma.  Presents to the emergency department with a chief complaint of flulike illness.  Patient reports that yesterday he developed generalized myalgia, chills, diarrhea, fatigue, and productive cough.  Patient reports that cough is producing clear to yellow mucus.  Patient reports that his symptoms were improved when taking NyQuil.  Patient reports he has not been vaccinated for COVID-19.  Patient denies any known sick contacts however works with the general public.     HPI     Past Medical History:  Diagnosis Date   Allergy    Asthma     There are no problems to display for this patient.   History reviewed. No pertinent surgical history.     History reviewed. No pertinent family history.  Social History   Tobacco Use   Smoking status: Heavy Smoker   Smokeless tobacco: Never  Substance Use Topics   Alcohol use: Yes    Alcohol/week: 0.0 standard drinks   Drug use: No    Home Medications Prior to Admission medications   Medication Sig Start Date End Date Taking? Authorizing Provider  albuterol (PROVENTIL HFA;VENTOLIN HFA) 108 (90 Base) MCG/ACT inhaler Inhale 1-2 puffs into the lungs every 6 (six) hours as needed for wheezing or shortness of breath. 03/12/18   Long, Arlyss Repress, MD  naproxen sodium (ANAPROX DS) 550 MG tablet Take 1 tablet (550 mg total) by mouth 2 (two) times daily with a meal. Patient not taking: Reported on 10/23/2015 10/22/15   Wallis Bamberg, PA-C  ondansetron (ZOFRAN) 4 MG tablet Take 1 tablet (4 mg total) by mouth every 6 (six) hours. 04/25/16   Marlon Pel, PA-C    Allergies    Pollen extract  Review of Systems   Review of Systems  Constitutional:  Positive for chills and fatigue. Negative for  fever.  HENT:  Negative for congestion, drooling, rhinorrhea, sore throat and trouble swallowing.   Eyes:  Negative for visual disturbance.  Respiratory:  Positive for cough. Negative for shortness of breath.   Cardiovascular:  Negative for chest pain and leg swelling.  Gastrointestinal:  Positive for diarrhea. Negative for abdominal pain, nausea and vomiting.  Musculoskeletal:  Positive for myalgias. Negative for back pain and neck pain.  Skin:  Negative for color change and rash.  Neurological:  Negative for dizziness, syncope, light-headedness and headaches.  Psychiatric/Behavioral:  Negative for confusion.    Physical Exam Updated Vital Signs BP 123/85 (BP Location: Right Arm)   Pulse 80   Temp 98.3 F (36.8 C) (Oral)   Resp 18   Ht 6\' 5"  (1.956 m)   Wt 65.8 kg   SpO2 96%   BMI 17.19 kg/m   Physical Exam Vitals and nursing note reviewed.  Constitutional:      General: He is not in acute distress.    Appearance: He is not ill-appearing, toxic-appearing or diaphoretic.  HENT:     Head: Normocephalic.     Mouth/Throat:     Lips: Pink. No lesions.     Mouth: Mucous membranes are moist.     Tongue: No lesions. Tongue does not deviate from midline.     Palate: No mass and lesions.     Pharynx: Oropharynx is clear. Uvula midline. No  pharyngeal swelling, oropharyngeal exudate, posterior oropharyngeal erythema or uvula swelling.     Tonsils: No tonsillar exudate or tonsillar abscesses. 0 on the right. 0 on the left.     Comments: Handles oral secretions without difficulty Eyes:     General: No scleral icterus.       Right eye: No discharge.        Left eye: No discharge.  Cardiovascular:     Rate and Rhythm: Normal rate.  Pulmonary:     Effort: Pulmonary effort is normal. No tachypnea, bradypnea or respiratory distress.     Breath sounds: Normal breath sounds. No stridor.     Comments: Patient speaks in full clear sentences without difficulty Skin:    General: Skin is  warm and dry.  Neurological:     General: No focal deficit present.     Mental Status: He is alert.  Psychiatric:        Behavior: Behavior is cooperative.    ED Results / Procedures / Treatments   Labs (all labs ordered are listed, but only abnormal results are displayed) Labs Reviewed  RESP PANEL BY RT-PCR (FLU A&B, COVID) ARPGX2 - Abnormal; Notable for the following components:      Result Value   SARS Coronavirus 2 by RT PCR POSITIVE (*)    All other components within normal limits    EKG None  Radiology No results found.  Procedures Procedures   Medications Ordered in ED Medications - No data to display  ED Course  I have reviewed the triage vital signs and the nursing notes.  Pertinent labs & imaging results that were available during my care of the patient were reviewed by me and considered in my medical decision making (see chart for details).    MDM Rules/Calculators/A&P                           Alert 25 year old male in no acute distress, nontoxic appearing.  Presents with flulike illness.  Symptoms started yesterday.  Respiratory panel was obtained patient positive for COVID-19.  Patient has history of asthma, patient was offered Paxil but however defers at this time.  We will give patient prescription for Tessalon.  Discussed results, findings, treatment and follow up. Patient advised of return precautions. Patient verbalized understanding and agreed with plan.  [name] was evaluated in Emergency Department on 12/26/2020 for the symptoms described in the history of present illness. He was evaluated in the context of the global COVID-19 pandemic, which necessitated consideration that the patient might be at risk for infection with the SARS-CoV-2 virus that causes COVID-19. Institutional protocols and algorithms that pertain to the evaluation of patients at risk for COVID-19 are in a state of rapid change based on information released by regulatory  bodies including the CDC and federal and state organizations. These policies and algorithms were followed during the patient's care in the ED.   Final Clinical Impression(s) / ED Diagnoses Final diagnoses:  COVID-19    Rx / DC Orders ED Discharge Orders          Ordered    benzonatate (TESSALON) 100 MG capsule  Every 8 hours PRN        12/26/20 0853             Haskel Schroeder, PA-C 12/26/20 1610    Linwood Dibbles, MD 12/27/20 [phone]

## 2020-12-26 NOTE — Discharge Instructions (Addendum)
You came to the emergency department today with reports of Covid-19 like symptoms.   You tested positive for COVID-19. Please isolate at home for at least 7 days after the day your symptoms initially began, and THEN at least 24 hours after you are fever-free without the help of medications (Tylenol/acetaminophen and Advil/ibuprofen/Motrin) AND your symptoms are improving.  You can alternate Tylenol/acetaminophen and Advil/ibuprofen/Motrin every 4 hours for sore throat, body aches, headache or fever.  Drink plenty of water.  Use saline nasal spray for congestion. You can take Tessalon every 8 hours as needed for cough. Wash your hands frequently. Please rest as needed with frequent repositioning and ambulation as tolerated.    If you use a CPAP or BiPAP device for management of obstructive sleep apnea may continue to use it however use it when isolated from other individuals to avoid spread of COVID-19.   If you use a nebulizer administer medication such as albuterol you may continue to use it however only one isolated from other individuals to avoid the spread of COVID-19.  If your symptoms do not improve please follow-up with your primary care provider or urgent care.  Return to the ER for significant shortness of breath, uncontrollable vomiting, severe chest pain, inability to tolerate fluids, changes in mental status such as confusion or other concerning symptoms.  

## 2022-06-27 ENCOUNTER — Ambulatory Visit
Admission: EM | Admit: 2022-06-27 | Discharge: 2022-06-27 | Disposition: A | Discharge status: Home / Self Care | Payer: Medicaid Other | Attending: Urgent Care | Admitting: Urgent Care

## 2022-06-27 DIAGNOSIS — S60942A Unspecified superficial injury of right middle finger, initial encounter: Secondary | ICD-10-CM | POA: Diagnosis not present

## 2022-06-27 DIAGNOSIS — S61212A Laceration without foreign body of right middle finger without damage to nail, initial encounter: Secondary | ICD-10-CM | POA: Diagnosis not present

## 2022-06-27 DIAGNOSIS — L089 Local infection of the skin and subcutaneous tissue, unspecified: Secondary | ICD-10-CM | POA: Diagnosis not present

## 2022-06-27 MED ORDER — IBUPROFEN 600 MG PO TABS: 600.0000 mg | ORAL_TABLET | Freq: Four times a day (QID) | ORAL | 0 refills | Status: DC | PRN

## 2022-06-27 MED ORDER — DOXYCYCLINE HYCLATE 100 MG PO CAPS: 100.0000 mg | ORAL_CAPSULE | Freq: Two times a day (BID) | ORAL | 0 refills | Status: DC

## 2022-06-27 NOTE — Discharge Instructions (Signed)
Start doxycycline for the infection. Use ibuprofen for the pain.  Please change your dressing 3-5 times daily. Do not apply any ointments or creams. Each time you change your dressing, make sure that you are pressing on the wound to get pus to come out.  Try your best to clean the wound with antibacterial soap and warm water. Pat your wound dry and let it air out if possible to make sure it is dry before reapplying another dressing.

## 2022-06-27 NOTE — ED Provider Notes (Signed)
[facility]  Note:  This document was prepared using Systems analyst and may include unintentional dictation errors.  MRN: [id number] DOB: 09/11/1995  Subjective:   Luis Solomon is a 27 y.o. male presenting for suffering a right middle finger laceration yesterday while performing an oil change.  This injury happened at approximately 11 AM and unfortunately he got a lot of the oils and debridement into the wound.  He tried to keep the wound clean and covered.  Unfortunately today, had more pain and swelling.  No fever, spontaneous drainage of pus or bleeding.  Tdap was updated last year.   Current Facility-Administered Medications:    ALPRAZolam (XANAX) tablet 0.5 mg, 0.5 mg, Oral, Once, Jaynee Eagles, PA-C  Current Outpatient Medications:    albuterol (PROVENTIL HFA;VENTOLIN HFA) 108 (90 Base) MCG/ACT inhaler, Inhale 1-2 puffs into the lungs every 6 (six) hours as needed for wheezing or shortness of breath., Disp: 1 Inhaler, Rfl: 0   benzonatate (TESSALON) 100 MG capsule, Take 1 capsule (100 mg total) by mouth every 8 (eight) hours as needed for cough., Disp: 21 capsule, Rfl: 0   naproxen sodium (ANAPROX DS) 550 MG tablet, Take 1 tablet (550 mg total) by mouth 2 (two) times daily with a meal. (Patient not taking: Reported on 10/23/2015), Disp: 30 tablet, Rfl: 0   ondansetron (ZOFRAN) 4 MG tablet, Take 1 tablet (4 mg total) by mouth every 6 (six) hours., Disp: 12 tablet, Rfl: 0   Allergies  Allergen Reactions   Pollen Extract     Past Medical History:  Diagnosis Date   Allergy    Asthma      History reviewed. No pertinent surgical history.  No family history on file.  Social History   Tobacco Use   Smoking status: Some Days    Types: Cigars   Smokeless tobacco: Never  Vaping Use   Vaping Use: Never used  Substance Use Topics   Alcohol use: Not Currently   Drug use: No    ROS   Objective:   Vitals: BP 114/71 (BP  Location: Left Arm)   Pulse 60   Temp 98.3 F (36.8 C) (Oral)   Resp 20   SpO2 98%   Physical Exam Constitutional:      General: He is not in acute distress.    Appearance: Normal appearance. He is well-developed and normal weight. He is not ill-appearing, toxic-appearing or diaphoretic.  HENT:     Head: Normocephalic and atraumatic.     Right Ear: External ear normal.     Left Ear: External ear normal.     Nose: Nose normal.     Mouth/Throat:     Pharynx: Oropharynx is clear.  Eyes:     General: No scleral icterus.       Right eye: No discharge.        Left eye: No discharge.     Extraocular Movements: Extraocular movements intact.  Cardiovascular:     Rate and Rhythm: Normal rate.  Pulmonary:     Effort: Pulmonary effort is normal.  Musculoskeletal:       Hands:     Cervical back: Normal range of motion.  Neurological:     Mental Status: He is alert and oriented to person, place, and time.  Psychiatric:        Mood and Affect: Mood normal.        Behavior: Behavior normal.        Thought Content:  Thought content normal.        Judgment: Judgment normal.     Dressing was applied to the right middle finger.  Assessment and Plan :   PDMP not reviewed this encounter.  1. Superficial injury of right middle finger with infection   2. Laceration of right middle finger without foreign body without damage to nail, initial encounter     Recommended starting doxycycline to cover for secondary wound infection of his superficial laceration.  Discussed wound care.  Ibuprofen for pain relief.  Strict ER precautions reviewed. Counseled patient on potential for adverse effects with medications prescribed today, patient verbalized understanding.    Jaynee Eagles, PA-C 06/27/22 1058

## 2022-06-27 NOTE — ED Triage Notes (Signed)
Pt reports injury to right hand while at work/doing an oil change ~11am yesterday-lac to middle finger and superficial lac/abrasion between index and middle finger-NAD-steady gait

## 2022-10-07 ENCOUNTER — Emergency Department (HOSPITAL_COMMUNITY): Payer: Medicaid Other

## 2022-10-07 ENCOUNTER — Other Ambulatory Visit: Payer: Self-pay

## 2022-10-07 ENCOUNTER — Emergency Department (HOSPITAL_COMMUNITY)
Admission: EM | Admit: 2022-10-07 | Discharge: 2022-10-07 | Disposition: A | Discharge status: Home / Self Care | Payer: Medicaid Other | Attending: Emergency Medicine | Admitting: Emergency Medicine

## 2022-10-07 DIAGNOSIS — W19XXXA Unspecified fall, initial encounter: Secondary | ICD-10-CM | POA: Insufficient documentation

## 2022-10-07 DIAGNOSIS — Y990 Civilian activity done for income or pay: Secondary | ICD-10-CM | POA: Diagnosis not present

## 2022-10-07 DIAGNOSIS — M25562 Pain in left knee: Secondary | ICD-10-CM | POA: Insufficient documentation

## 2022-10-07 MED ORDER — HYDROCODONE-ACETAMINOPHEN 5-325 MG PO TABS: 2.0000 | ORAL_TABLET | ORAL | 0 refills | Status: DC | PRN

## 2022-10-07 NOTE — ED Triage Notes (Signed)
Pt reports fall 4 days ago c/o left knee pain and numbness.

## 2022-10-07 NOTE — ED Provider Notes (Signed)
[facility] EMERGENCY DEPARTMENT AT [facility] Provider Note   CSN: [id number] Arrival date & time: 10/07/22  4098     History  Chief Complaint  Patient presents with   Knee Pain    Luis Solomon is a 27 y.o. male.  27 year old male presents today for evaluation of left knee pain.  The fall occurred 3 days ago.  He works at an [employer].  He states he missed a step and struck his left knee on a metal platform.  Has a skin abrasion just distal to the left knee joint.  Has an antalgic gait.  Is able to bear weight.  Some residual numbness however significantly improved from a few days ago.  Has been taking Tylenol and ibuprofen for pain control.  His mom states she has pain medication that she is prescribed however patient states he would not take them as they were not prescribed to him.  He is up-to-date on tetanus.  This was updated about 4 and half years ago.  The history is provided by the patient. No language interpreter was used.       Home Medications Prior to Admission medications   Medication Sig Start Date End Date Taking? Authorizing Provider  albuterol (PROVENTIL HFA;VENTOLIN HFA) 108 (90 Base) MCG/ACT inhaler Inhale 1-2 puffs into the lungs every 6 (six) hours as needed for wheezing or shortness of breath. 03/12/18   Long, Arlyss Repress, MD  benzonatate (TESSALON) 100 MG capsule Take 1 capsule (100 mg total) by mouth every 8 (eight) hours as needed for cough. 12/26/20   Haskel Schroeder, PA-C  doxycycline (VIBRAMYCIN) 100 MG capsule Take 1 capsule (100 mg total) by mouth 2 (two) times daily. 06/27/22   Wallis Bamberg, PA-C  ibuprofen (ADVIL) 600 MG tablet Take 1 tablet (600 mg total) by mouth every 6 (six) hours as needed. 06/27/22   Wallis Bamberg, PA-C  naproxen sodium (ANAPROX DS) 550 MG tablet Take 1 tablet (550 mg total) by mouth 2 (two) times daily with a meal. Patient not taking: Reported on 10/23/2015 10/22/15   Wallis Bamberg, PA-C  ondansetron (ZOFRAN) 4 MG  tablet Take 1 tablet (4 mg total) by mouth every 6 (six) hours. 04/25/16   Marlon Pel, PA-C      Allergies    Pollen extract    Review of Systems   Review of Systems  Constitutional:  Negative for fever.  Musculoskeletal:  Positive for arthralgias. Negative for joint swelling.  All other systems reviewed and are negative.   Physical Exam Updated Vital Signs BP 120/70 (BP Location: Left Arm)   Pulse 69   Temp 98 F (36.7 C) (Oral)   Resp 17   Ht 6\' 3"  (1.905 m)   Wt 63.5 kg   SpO2 97%   BMI 17.50 kg/m  Physical Exam Vitals and nursing note reviewed.  Constitutional:      General: He is not in acute distress.    Appearance: Normal appearance. He is not ill-appearing.  HENT:     Head: Normocephalic and atraumatic.     Nose: Nose normal.  Eyes:     Conjunctiva/sclera: Conjunctivae normal.  Cardiovascular:     Rate and Rhythm: Normal rate.  Pulmonary:     Effort: Pulmonary effort is normal. No respiratory distress.  Musculoskeletal:        General: No deformity.  Skin:    Findings: No rash.  Neurological:     Mental Status: He is alert.  ED Results / Procedures / Treatments   Labs (all labs ordered are listed, but only abnormal results are displayed) Labs Reviewed - No data to display  EKG None  Radiology No results found.  Procedures Procedures    Medications Ordered in ED Medications - No data to display  ED Course/ Medical Decision Making/ A&P                             Medical Decision Making Amount and/or Complexity of Data Reviewed Radiology: ordered.   27 year old male presents today for evaluation of left knee pain.  X-ray obtained.  No acute bony abnormality.  Potential minimal joint effusion.  Good range of motion on exam.  Tenderness to palpation present over the left left patellar ligament.  Small abrasion noted lateral to the patellar ligament.  Good strength.  Antalgic gait noted.  Neurovascularly intact in the left lower  extremity.  Symptomatic management discussed.  Given severity of pain will give short course of pain medication.  Discussed primarily using Tylenol and ibuprofen.  Sports medicine referral given.  Patient voices understanding and is in agreement with plan. Crutches provided at patient's request.  Discussed weight bearing as tolerated  Final Clinical Impression(s) / ED Diagnoses Final diagnoses:  Acute pain of left knee    Rx / DC Orders ED Discharge Orders          Ordered    HYDROcodone-acetaminophen (NORCO/VICODIN) 5-325 MG tablet  Every 4 hours PRN        10/07/22 0954              Marita Kansas, PA-C 10/07/22 0955    Tegeler, Canary Brim, MD 10/07/22 1335

## 2022-10-07 NOTE — Discharge Instructions (Addendum)
No fractures on x-ray.  Likely have ligamentous injury.  I have sent in short course of pain medication into the pharmacy for you.  However continue using Tylenol and ibuprofen.  Tylenol 1000 mg every 8 hours.  Ibuprofen 600-800 mg every 8 hours.  If you have any concerning symptoms return to the emergency room otherwise have given you follow-up for sports medicine.

## 2023-01-21 ENCOUNTER — Emergency Department (HOSPITAL_COMMUNITY)
Admission: EM | Admit: 2023-01-21 | Discharge: 2023-01-21 | Discharge status: Against Medical Advice | Payer: Medicaid Other

## 2023-01-21 NOTE — ED Notes (Signed)
Called pt for triage, pt was outside walking across parking lot seen by security officers, pt stated he was going back home, mother with pt and are seen leaving facility.

## 2023-01-22 ENCOUNTER — Other Ambulatory Visit: Payer: Self-pay

## 2023-01-22 ENCOUNTER — Emergency Department (HOSPITAL_COMMUNITY): Payer: PRIVATE HEALTH INSURANCE

## 2023-01-22 ENCOUNTER — Encounter (HOSPITAL_COMMUNITY): Payer: Self-pay | Admitting: Emergency Medicine

## 2023-01-22 ENCOUNTER — Emergency Department (HOSPITAL_COMMUNITY)
Admission: EM | Admit: 2023-01-22 | Discharge: 2023-01-22 | Disposition: A | Discharge status: Home / Self Care | Payer: PRIVATE HEALTH INSURANCE | Attending: Emergency Medicine | Admitting: Emergency Medicine

## 2023-01-22 DIAGNOSIS — R1084 Generalized abdominal pain: Secondary | ICD-10-CM | POA: Insufficient documentation

## 2023-01-22 DIAGNOSIS — R112 Nausea with vomiting, unspecified: Secondary | ICD-10-CM | POA: Insufficient documentation

## 2023-01-22 LAB — COMPREHENSIVE METABOLIC PANEL
ALT: 19 U/L (ref 0–44)
AST: 28 U/L (ref 15–41)
Albumin: 5.3 g/dL — ABNORMAL HIGH (ref 3.5–5.0)
Alkaline Phosphatase: 75 U/L (ref 38–126)
Anion gap: 17 — ABNORMAL HIGH (ref 5–15)
BUN: 19 mg/dL (ref 6–20)
CO2: 18 mmol/L — ABNORMAL LOW (ref 22–32)
Calcium: 10.1 mg/dL (ref 8.9–10.3)
Chloride: 99 mmol/L (ref 98–111)
Creatinine, Ser: 1.06 mg/dL (ref 0.61–1.24)
GFR, Estimated: >60 mL/min (ref >60)
Glucose, Bld: 114 mg/dL — ABNORMAL HIGH (ref 70–99)
Potassium: 3.9 mmol/L (ref 3.5–5.1)
Sodium: 134 mmol/L — ABNORMAL LOW (ref 135–145)
Total Bilirubin: 1.5 mg/dL — ABNORMAL HIGH (ref 0.3–1.2)
Total Protein: 8.8 g/dL — ABNORMAL HIGH (ref 6.5–8.1)

## 2023-01-22 LAB — CBC WITH DIFFERENTIAL/PLATELET
Abs Immature Granulocytes: 0.03 10*3/uL (ref 0.00–0.07)
Basophils Absolute: 0 10*3/uL (ref 0.0–0.1)
Basophils Relative: 0 %
Eosinophils Absolute: 0 10*3/uL (ref 0.0–0.5)
Eosinophils Relative: 0 %
HCT: 43 % (ref 39.0–52.0)
Hemoglobin: 15.3 g/dL (ref 13.0–17.0)
Immature Granulocytes: 0 %
Lymphocytes Relative: 6 %
Lymphs Abs: 0.7 10*3/uL (ref 0.7–4.0)
MCH: 34.9 pg — ABNORMAL HIGH (ref 26.0–34.0)
MCHC: 35.6 g/dL (ref 30.0–36.0)
MCV: 97.9 fL (ref 80.0–100.0)
Monocytes Absolute: 0.3 10*3/uL (ref 0.1–1.0)
Monocytes Relative: 2 %
Neutro Abs: 11 10*3/uL — ABNORMAL HIGH (ref 1.7–7.7)
Neutrophils Relative %: 92 %
Platelets: 174 10*3/uL (ref 150–400)
RBC: 4.39 MIL/uL (ref 4.22–5.81)
RDW: 11 % — ABNORMAL LOW (ref 11.5–15.5)
WBC: 11.9 10*3/uL — ABNORMAL HIGH (ref 4.0–10.5)
nRBC: 0 % (ref 0.0–0.2)

## 2023-01-22 LAB — LIPASE, BLOOD: Lipase: 23 U/L (ref 11–51)

## 2023-01-22 MED ORDER — KETOROLAC TROMETHAMINE 30 MG/ML IJ SOLN
15.0000 mg | Freq: Once | INTRAMUSCULAR | Status: AC
  Administered 2023-01-22: 15 mg via INTRAVENOUS
  Filled 2023-01-22: qty 1

## 2023-01-22 MED ORDER — HYOSCYAMINE SULFATE 0.125 MG SL SUBL
0.2500 mg | SUBLINGUAL_TABLET | SUBLINGUAL | Status: AC
  Administered 2023-01-22: 0.25 mg via SUBLINGUAL
  Filled 2023-01-22: qty 2

## 2023-01-22 MED ORDER — IOHEXOL 300 MG/ML  SOLN
100.0000 mL | Freq: Once | INTRAMUSCULAR | Status: AC | PRN
  Administered 2023-01-22: 100 mL via INTRAVENOUS

## 2023-01-22 MED ORDER — ONDANSETRON 4 MG PO TBDP: ORAL_TABLET | ORAL | 0 refills | Status: DC

## 2023-01-22 MED ORDER — ONDANSETRON HCL 4 MG/2ML IJ SOLN
4.0000 mg | Freq: Once | INTRAMUSCULAR | Status: AC
Start: 2023-01-22 — End: 2023-01-22
  Administered 2023-01-22: 4 mg via INTRAVENOUS
  Filled 2023-01-22: qty 2

## 2023-01-22 MED ORDER — METOCLOPRAMIDE HCL 5 MG/ML IJ SOLN
10.0000 mg | Freq: Once | INTRAMUSCULAR | Status: AC
Start: 2023-01-22 — End: 2023-01-22
  Administered 2023-01-22: 10 mg via INTRAVENOUS
  Filled 2023-01-22: qty 2

## 2023-01-22 MED ORDER — MORPHINE SULFATE (PF) 4 MG/ML IV SOLN
4.0000 mg | Freq: Once | INTRAVENOUS | Status: AC
  Administered 2023-01-22: 4 mg via INTRAVENOUS
  Filled 2023-01-22: qty 1

## 2023-01-22 MED ORDER — SODIUM CHLORIDE 0.9 % IV BOLUS
1000.0000 mL | Freq: Once | INTRAVENOUS | Status: AC
  Administered 2023-01-22: 1000 mL via INTRAVENOUS

## 2023-01-22 MED ORDER — DICYCLOMINE HCL 20 MG PO TABS: 20.0000 mg | ORAL_TABLET | Freq: Three times a day (TID) | ORAL | 0 refills | Status: AC | PRN

## 2023-01-22 MED ORDER — HYDROMORPHONE HCL 1 MG/ML IJ SOLN
1.0000 mg | Freq: Once | INTRAMUSCULAR | Status: AC
  Administered 2023-01-22: 1 mg via INTRAVENOUS
  Filled 2023-01-22: qty 1

## 2023-01-22 NOTE — ED Triage Notes (Signed)
Pt BIBA presenting with severe lower abd pain, n/v, chills that started approx 1600 on 01/21/23 after eating. A&O x4. Denies chest pain or any other symptoms.

## 2023-01-22 NOTE — ED Provider Notes (Signed)
[place] EMERGENCY DEPARTMENT AT [facility] Provider Note   CSN: [id number] Arrival date & time: 01/22/23  0038     History  Chief Complaint  Patient presents with   Abdominal Pain   Nausea    Luis Solomon is a 27 y.o. male.  Presents to the department for evaluation of nausea, vomiting, diffuse abdominal pain and cramping that comes in waves.  He has been experiencing chills.  Symptoms began this evening after eating.  He has not had any diarrhea.       Home Medications Prior to Admission medications   Medication Sig Start Date End Date Taking? Authorizing Provider  dicyclomine (BENTYL) 20 MG tablet Take 1 tablet (20 mg total) by mouth 3 (three) times daily as needed (abdominal pain). 01/22/23  Yes Gilda Crease, MD  ondansetron (ZOFRAN-ODT) 4 MG disintegrating tablet 4mg  ODT q4 hours prn nausea/vomit 01/22/23  Yes Rosselyn Martha, Canary Brim, MD      Allergies    Pollen extract    Review of Systems   Review of Systems  Physical Exam Updated Vital Signs BP 131/76   Pulse 67   Temp 98.3 F (36.8 C) (Oral)   Resp 17   Ht 6\' 3"  (1.905 m)   Wt 63.5 kg   SpO2 100%   BMI 17.50 kg/m  Physical Exam Vitals and nursing note reviewed.  Constitutional:      General: He is not in acute distress.    Appearance: He is well-developed.  HENT:     Head: Normocephalic and atraumatic.     Mouth/Throat:     Mouth: Mucous membranes are moist.  Eyes:     General: Vision grossly intact. Gaze aligned appropriately.     Extraocular Movements: Extraocular movements intact.     Conjunctiva/sclera: Conjunctivae normal.  Cardiovascular:     Rate and Rhythm: Normal rate and regular rhythm.     Pulses: Normal pulses.     Heart sounds: Normal heart sounds, S1 normal and S2 normal. No murmur heard.    No friction rub. No gallop.  Pulmonary:     Effort: Pulmonary effort is normal. No respiratory distress.     Breath sounds: Normal breath sounds.   Abdominal:     Palpations: Abdomen is soft.     Tenderness: There is generalized abdominal tenderness. There is no guarding or rebound.     Hernia: No hernia is present.  Musculoskeletal:        General: No swelling.     Cervical back: Full passive range of motion without pain, normal range of motion and neck supple. No pain with movement, spinous process tenderness or muscular tenderness. Normal range of motion.     Right lower leg: No edema.     Left lower leg: No edema.  Skin:    General: Skin is warm and dry.     Capillary Refill: Capillary refill takes less than 2 seconds.     Findings: No ecchymosis, erythema, lesion or wound.  Neurological:     Mental Status: He is alert and oriented to person, place, and time.     GCS: GCS eye subscore is 4. GCS verbal subscore is 5. GCS motor subscore is 6.     Cranial Nerves: Cranial nerves 2-12 are intact.     Sensory: Sensation is intact.     Motor: Motor function is intact. No weakness or abnormal muscle tone.     Coordination: Coordination is intact.  Psychiatric:  Mood and Affect: Mood normal.        Speech: Speech normal.        Behavior: Behavior normal.     ED Results / Procedures / Treatments   Labs (all labs ordered are listed, but only abnormal results are displayed) Labs Reviewed  CBC WITH DIFFERENTIAL/PLATELET - Abnormal; Notable for the following components:      Result Value   WBC 11.9 (*)    MCH 34.9 (*)    RDW 11.0 (*)    Neutro Abs 11.0 (*)    All other components within normal limits  COMPREHENSIVE METABOLIC PANEL - Abnormal; Notable for the following components:   Sodium 134 (*)    CO2 18 (*)    Glucose, Bld 114 (*)    Total Protein 8.8 (*)    Albumin 5.3 (*)    Total Bilirubin 1.5 (*)    Anion gap 17 (*)    All other components within normal limits  LIPASE, BLOOD  URINALYSIS, ROUTINE W REFLEX MICROSCOPIC    EKG None  Radiology CT ABDOMEN PELVIS W CONTRAST  Result Date:  01/22/2023 CLINICAL DATA:  Lower abdominal pain with nausea, vomiting and chills. EXAM: CT ABDOMEN AND PELVIS WITH CONTRAST TECHNIQUE: Multidetector CT imaging of the abdomen and pelvis was performed using the standard protocol following bolus administration of intravenous contrast. RADIATION DOSE REDUCTION: This exam was performed according to the departmental dose-optimization program which includes automated exposure control, adjustment of the mA and/or kV according to patient size and/or use of iterative reconstruction technique. CONTRAST:  OMNIPAQUE IOHEXOL 300 MG/ML  SOLN COMPARISON:  None Available. FINDINGS: Lower chest: No acute abnormality. Hepatobiliary: No focal liver abnormality is seen. No gallstones, gallbladder wall thickening, or biliary dilatation. Pancreas: Unremarkable. No pancreatic ductal dilatation or surrounding inflammatory changes. Spleen: Normal in size without focal abnormality. Adrenals/Urinary Tract: Adrenal glands are unremarkable. Kidneys are normal, without renal calculi, focal lesion, or hydronephrosis. The urinary bladder is empty and subsequently limited in evaluation. Stomach/Bowel: Stomach is within normal limits. Appendix appears normal. No evidence of bowel wall thickening, distention, or inflammatory changes. Vascular/Lymphatic: No significant vascular findings are present. No enlarged abdominal or pelvic lymph nodes. Reproductive: Prostate is unremarkable. Other: No abdominal wall hernia or abnormality. No abdominopelvic ascites. Musculoskeletal: No acute or significant osseous findings. IMPRESSION: No acute or active process within the abdomen or pelvis. Electronically Signed   By: Aram Candela M.D.   On: 01/22/2023 03:32    Procedures Procedures    Medications Ordered in ED Medications  ketorolac (TORADOL) 30 MG/ML injection 15 mg (has no administration in time range)  sodium chloride 0.9 % bolus 1,000 mL (0 mLs Intravenous Stopped 01/22/23 0207)   morphine (PF) 4 MG/ML injection 4 mg (4 mg Intravenous Given 01/22/23 0105)  ondansetron (ZOFRAN) injection 4 mg (4 mg Intravenous Given 01/22/23 0106)  metoCLOPramide (REGLAN) injection 10 mg (10 mg Intravenous Given 01/22/23 0107)  iohexol (OMNIPAQUE) 300 MG/ML solution 100 mL (100 mLs Intravenous Contrast Given 01/22/23 0306)  HYDROmorphone (DILAUDID) injection 1 mg (1 mg Intravenous Given 01/22/23 0348)  hyoscyamine (LEVSIN SL) SL tablet 0.25 mg (0.25 mg Sublingual Given 01/22/23 0400)    ED Course/ Medical Decision Making/ A&P                                 Medical Decision Making Amount and/or Complexity of Data Reviewed Labs: ordered. Radiology: ordered.  Risk Prescription  drug management.   Differential Diagnosis considered includes, but not limited to: Cholelithiasis; cholecystitis; cholangitis; bowel obstruction; esophagitis; gastritis; peptic ulcer disease; pancreatitis; cardiac.  Presents to the emergency department for evaluation of diffuse abdominal pain with intermittent sharp stabbing cramps.  This has caused nausea and vomiting.  Pain onset after eating.  Examination reveals diffuse tenderness, no peritonitis.  Vital signs are unremarkable.  Patient provided analgesia.  Lab work is normal.  Patient had some pain come back after the first dose of pain medicine.  He was redosed and a CT was performed to further evaluate.  No acute processes identified.  Upon recheck patient is resting comfortably.  Will discharge with symptomatic treatment, given return precautions.        Final Clinical Impression(s) / ED Diagnoses Final diagnoses:  Generalized abdominal pain    Rx / DC Orders ED Discharge Orders          Ordered    ondansetron (ZOFRAN-ODT) 4 MG disintegrating tablet        01/22/23 0445    dicyclomine (BENTYL) 20 MG tablet  3 times daily PRN        01/22/23 0445              Gilda Crease, MD 01/22/23 [phone]

## 2023-10-17 ENCOUNTER — Emergency Department (HOSPITAL_COMMUNITY)
Admission: EM | Admit: 2023-10-17 | Discharge: 2023-10-17 | Disposition: A | Discharge status: Home / Self Care | Attending: Emergency Medicine | Admitting: Emergency Medicine

## 2023-10-17 ENCOUNTER — Encounter (HOSPITAL_COMMUNITY): Payer: Self-pay

## 2023-10-17 ENCOUNTER — Emergency Department (HOSPITAL_COMMUNITY)

## 2023-10-17 ENCOUNTER — Other Ambulatory Visit: Payer: Self-pay

## 2023-10-17 DIAGNOSIS — J45909 Unspecified asthma, uncomplicated: Secondary | ICD-10-CM | POA: Insufficient documentation

## 2023-10-17 DIAGNOSIS — M546 Pain in thoracic spine: Secondary | ICD-10-CM | POA: Diagnosis not present

## 2023-10-17 DIAGNOSIS — F17200 Nicotine dependence, unspecified, uncomplicated: Secondary | ICD-10-CM | POA: Insufficient documentation

## 2023-10-17 DIAGNOSIS — R0789 Other chest pain: Secondary | ICD-10-CM | POA: Diagnosis present

## 2023-10-17 LAB — BASIC METABOLIC PANEL WITH GFR
Anion gap: 12 (ref 5–15)
BUN: 16 mg/dL (ref 6–20)
CO2: 22 mmol/L (ref 22–32)
Calcium: 9.1 mg/dL (ref 8.9–10.3)
Chloride: 104 mmol/L (ref 98–111)
Creatinine, Ser: 1.03 mg/dL (ref 0.61–1.24)
GFR, Estimated: >60 mL/min (ref >60)
Glucose, Bld: 111 mg/dL — ABNORMAL HIGH (ref 70–99)
Potassium: 3.8 mmol/L (ref 3.5–5.1)
Sodium: 138 mmol/L (ref 135–145)

## 2023-10-17 LAB — CBC
HCT: 44.1 % (ref 39.0–52.0)
Hemoglobin: 15 g/dL (ref 13.0–17.0)
MCH: 34.5 pg — ABNORMAL HIGH (ref 26.0–34.0)
MCHC: 34 g/dL (ref 30.0–36.0)
MCV: 101.4 fL — ABNORMAL HIGH (ref 80.0–100.0)
Platelets: 183 K/uL (ref 150–400)
RBC: 4.35 MIL/uL (ref 4.22–5.81)
RDW: 11.5 % (ref 11.5–15.5)
WBC: 5.2 K/uL (ref 4.0–10.5)
nRBC: 0 % (ref 0.0–0.2)

## 2023-10-17 LAB — TROPONIN I (HIGH SENSITIVITY): Troponin I (High Sensitivity): <2 ng/L (ref <18)

## 2023-10-17 MED ORDER — KETOROLAC TROMETHAMINE 15 MG/ML IJ SOLN
15.0000 mg | Freq: Once | INTRAMUSCULAR | Status: AC
  Administered 2023-10-17: 15 mg via INTRAMUSCULAR
  Filled 2023-10-17: qty 1

## 2023-10-17 MED ORDER — NAPROXEN 500 MG PO TABS: 500.0000 mg | ORAL_TABLET | Freq: Two times a day (BID) | ORAL | 0 refills | Status: AC

## 2023-10-17 NOTE — Discharge Instructions (Addendum)
 You were seen today for chest and back pain.  Your presentation is most consistent with a musculoskeletal pain.  I have prescribed anti-inflammatory medications.  Please take as directed.  You may use ice and heat if you find them beneficial.  Follow-up with your primary care provider.  If you develop any life-threatening symptoms such as new onset shortness of breath please return immediately to the emergency department.

## 2023-10-17 NOTE — ED Notes (Signed)
 Patient has blue top tube in lab.

## 2023-10-17 NOTE — ED Provider Notes (Signed)
  EMERGENCY DEPARTMENT AT [facility] Provider Note   CSN: [id number] Arrival date & time: 10/17/23  9660     Patient presents with: Chest Pain   Luis Solomon is a 28 y.o. male.  Patient with past medical history significant for asthma presents to the emergency department complaining of left-sided chest pain and back pain.  He states that symptoms started 2 days ago.  He complains of tenderness in the anterior left chest and in the back just medial to the left scapula.  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  He states he feels like his breath catches when he takes a deep breath due to pain in the back.  He denies abdominal pain, nausea, vomiting.  Pain is reproducible with movement.    Chest Pain      Prior to Admission medications   Medication Sig Start Date End Date Taking? Authorizing Provider  naproxen  (NAPROSYN ) 500 MG tablet Take 1 tablet (500 mg total) by mouth 2 (two) times daily. 10/17/23  Yes Logan Ubaldo NOVAK, PA-C  dicyclomine  (BENTYL ) 20 MG tablet Take 1 tablet (20 mg total) by mouth 3 (three) times daily as needed (abdominal pain). Patient not taking: Reported on 10/17/2023 01/22/23   Haze Lonni PARAS, MD  ondansetron  (ZOFRAN -ODT) 4 MG disintegrating tablet 4mg  ODT q4 hours prn nausea/vomit Patient not taking: Reported on 10/17/2023 01/22/23   Haze Lonni PARAS, MD    Allergies: Pollen extract    Review of Systems  Cardiovascular:  Positive for chest pain.    Updated Vital Signs BP 130/84 (BP Location: Right Arm)   Pulse 71   Temp 97.9 F (36.6 C) (Oral)   Resp 19   Ht 6' 3 (1.905 m)   Wt 64.9 kg   SpO2 99%   BMI 17.87 kg/m   Physical Exam Vitals and nursing note reviewed.  Constitutional:      General: He is not in acute distress.    Appearance: He is  well-developed.  HENT:     Head: Normocephalic and atraumatic.  Eyes:     Conjunctiva/sclera: Conjunctivae normal.  Cardiovascular:     Rate and Rhythm: Normal rate and regular rhythm.  Pulmonary:     Effort: Pulmonary effort is normal. No respiratory distress.     Breath sounds: Normal breath sounds.  Chest:     Chest wall: Tenderness present.     Comments: Mild tenderness palpation of the anterior left chest Abdominal:     Palpations: Abdomen is soft.     Tenderness: There is no abdominal tenderness.  Musculoskeletal:        General: No swelling.     Cervical back: Neck supple.     Comments: Patient with tenderness to palpation just medial to the left scapula with a slightly swollen area  Skin:    General: Skin is warm and dry.     Capillary Refill: Capillary refill takes less than 2 seconds.  Neurological:     Mental Status: He is alert.  Psychiatric:        Mood and Affect: Mood normal.     (all labs ordered are listed, but only abnormal results are displayed) Labs Reviewed  BASIC METABOLIC PANEL WITH GFR - Abnormal; Notable for the following components:      Result Value  Glucose, Bld 111 (*)    All other components within normal limits  CBC - Abnormal; Notable for the following components:   MCV 101.4 (*)    MCH 34.5 (*)    All other components within normal limits  TROPONIN I (HIGH SENSITIVITY)    EKG: EKG Interpretation Date/Time:  Monday October 17 2023 03:53:03 EDT Ventricular Rate:  66 PR Interval:  167 QRS Duration:  73 QT Interval:  400 QTC Calculation: 420 R Axis:   70  Text Interpretation: Sinus rhythm Right atrial enlargement Anteroseptal infarct, old ST elevation suggests acute pericarditis No significant change was found Confirmed by Carita Senior [phone]) on 10/17/2023 4:21:55 AM  Radiology: [facility] Chest 2 View Result Date: 10/17/2023 CLINICAL DATA:  Left-sided chest pain starting 2 days ago. EXAM: CHEST - 2 VIEW COMPARISON:  03/12/2018 FINDINGS:  Heart size and pulmonary vascularity are normal. Lungs are clear. No pleural effusion or pneumothorax. Mediastinal contours appear intact. Visualized bones are nondisplaced. IMPRESSION: No active cardiopulmonary disease. Electronically Signed   By: Elsie Gravely M.D.   On: 10/17/2023 04:07     Procedures   Medications Ordered in the ED  ketorolac  (TORADOL ) 15 MG/ML injection 15 mg (15 mg Intramuscular Given 10/17/23 0529)                                    Medical Decision Making Amount and/or Complexity of Data Reviewed Labs: ordered. Radiology: ordered.  Risk Prescription drug management.   This patient presents to the ED for concern of chest pain, this involves an extensive number of treatment options, and is a complaint that carries with it a high risk of complications and morbidity.  The differential diagnosis includes musculoskeletal pain, anxiety, ACS, pneumothorax, pneumonia, others   Co morbidities / Chronic conditions that complicate the patient evaluation  Asthma   Additional history obtained:  Additional history obtained from EMR   Lab Tests:  I Ordered, and personally interpreted labs.  The pertinent results include: Troponin less than 2   Imaging Studies ordered:  I ordered imaging studies including chest x-ray I independently visualized and interpreted imaging which showed no acute findings I agree with the radiologist interpretation   Cardiac Monitoring: / EKG:  The patient was maintained on a cardiac monitor.  I personally viewed and interpreted the cardiac monitored which showed an underlying rhythm of: Sinus rhythm   Problem List / ED Course / Critical interventions / Medication management   I ordered medication including Toradol  Reevaluation of the patient after these medicines showed that the patient improved I have reviewed the patients home medicines and have made adjustments as needed   Social Determinants of Health:  Patient is a  daily smoker, has Medicaid for his primary health insurance    Test / Admission - Considered:  Patient with pain consistent with musculoskeletal pain.  Troponin less than 2.  Pain is an ongoing for 2 days.  Patient has easily reproducible pain and a palpable knot in the back and a tender area on the anterior chest.  Feel the patient likely had some sort of muscular strain.  Will prescribe Naprosyn  for inflammation control and recommend ice and heat for symptom management.  Work note provided at patient's request.  No indication for further emergent workup or admission.  Patient stable for discharge.      Final diagnoses:  Acute left-sided thoracic back pain  Chest wall pain  ED Discharge Orders          Ordered    naproxen  (NAPROSYN ) 500 MG tablet  2 times daily        10/17/23 0601               Logan Ubaldo KATHEE DEVONNA 10/17/23 0615    Carita Senior, MD 10/17/23 [id number]

## 2023-10-17 NOTE — ED Triage Notes (Signed)
 Pt complaining of pain in the left side of his chest that started 2 days ago. Said that he woke up yesterday and the pain had lessened so the put it off as a pulled muscle. Tonight he woke up with it hurting, sharp shooting pain causing him to have shortness of breath.

## 2023-11-10 ENCOUNTER — Emergency Department (HOSPITAL_COMMUNITY)
Admission: EM | Admit: 2023-11-10 | Discharge: 2023-11-10 | Disposition: A | Discharge status: Home / Self Care | Attending: Emergency Medicine | Admitting: Emergency Medicine

## 2023-11-10 ENCOUNTER — Encounter (HOSPITAL_COMMUNITY): Payer: Self-pay | Admitting: Emergency Medicine

## 2023-11-10 ENCOUNTER — Other Ambulatory Visit: Payer: Self-pay

## 2023-11-10 DIAGNOSIS — U071 COVID-19: Secondary | ICD-10-CM | POA: Diagnosis not present

## 2023-11-10 DIAGNOSIS — J45909 Unspecified asthma, uncomplicated: Secondary | ICD-10-CM | POA: Diagnosis not present

## 2023-11-10 DIAGNOSIS — R509 Fever, unspecified: Secondary | ICD-10-CM | POA: Diagnosis present

## 2023-11-10 LAB — RESP PANEL BY RT-PCR (RSV, FLU A&B, COVID)  RVPGX2
Influenza A by PCR: NEGATIVE
Influenza B by PCR: NEGATIVE
Resp Syncytial Virus by PCR: NEGATIVE
SARS Coronavirus 2 by RT PCR: POSITIVE — AB

## 2023-11-10 MED ORDER — ACETAMINOPHEN 325 MG PO TABS
650.0000 mg | ORAL_TABLET | Freq: Once | ORAL | Status: AC
  Administered 2023-11-10: 650 mg via ORAL
  Filled 2023-11-10: qty 2

## 2023-11-10 MED ORDER — ACETAMINOPHEN 500 MG PO TABS
1000.0000 mg | ORAL_TABLET | Freq: Three times a day (TID) | ORAL | 0 refills | Status: AC | PRN
Start: 2023-11-10 — End: ?

## 2023-11-10 MED ORDER — ONDANSETRON 4 MG PO TBDP
4.0000 mg | ORAL_TABLET | Freq: Three times a day (TID) | ORAL | 0 refills | Status: AC | PRN
Start: 2023-11-10 — End: ?

## 2023-11-10 NOTE — ED Triage Notes (Signed)
 Patient report flu like symptoms x 2 days. Patient report taking tylenol  for fever. Patient report nause, denies vomiting.

## 2023-11-10 NOTE — Discharge Instructions (Signed)
 Take 1000 mg of Tylenol  every 8 hours.  That should help with fever and muscle aches.  Take Zofran  as needed for nausea and vomiting.  Make sure staying well-hydrated with primarily water you can alternate Pedialyte and Gatorade.  Try bland foods.  Return to emergency room with new or worsening symptoms.

## 2023-11-10 NOTE — ED Provider Notes (Signed)
 [place] EMERGENCY DEPARTMENT AT [facility] Provider Note   CSN: [id number] Arrival date & time: 11/10/23  1149     Patient presents with: Flu like symptoms   Luis Solomon is a 28 y.o. male.  Patient with past medical history of allergy, asthma presents emergency room with 2 days of bodyaches, nausea, congestion, fever.  Reports his mother has similar symptoms which started yesterday.  He took Tylenol  which helped with fever and bodyaches.  Denies significant chest pain shortness of breath or severe headache.   HPI     Prior to Admission medications   Medication Sig Start Date End Date Taking? Authorizing Provider  acetaminophen  (TYLENOL ) 500 MG tablet Take 2 tablets (1,000 mg total) by mouth every 8 (eight) hours as needed for fever or headache. 11/10/23  Yes Arionna Hoggard N, PA-C  ondansetron  (ZOFRAN -ODT) 4 MG disintegrating tablet Take 1 tablet (4 mg total) by mouth every 8 (eight) hours as needed for nausea or vomiting. 11/10/23  Yes Thressa Shiffer N, PA-C  dicyclomine  (BENTYL ) 20 MG tablet Take 1 tablet (20 mg total) by mouth 3 (three) times daily as needed (abdominal pain). Patient not taking: Reported on 10/17/2023 01/22/23   Haze Lonni PARAS, MD  naproxen  (NAPROSYN ) 500 MG tablet Take 1 tablet (500 mg total) by mouth 2 (two) times daily. 10/17/23   Logan Ubaldo NOVAK, PA-C    Allergies: Pollen extract    Review of Systems  HENT:  Positive for congestion.     Updated Vital Signs BP 126/75 (BP Location: Right Arm)   Pulse 94   Temp 99.3 F (37.4 C) (Oral)   Resp 17   SpO2 97%   Physical Exam Vitals and nursing note reviewed.  Constitutional:      General: He is not in acute distress.    Appearance: He is not toxic-appearing.  HENT:     Head: Normocephalic and atraumatic.  Eyes:     General: No scleral icterus.    Conjunctiva/sclera: Conjunctivae normal.  Cardiovascular:     Rate and Rhythm: Normal rate and regular rhythm.     Pulses:  Normal pulses.     Heart sounds: Normal heart sounds.  Pulmonary:     Effort: Pulmonary effort is normal. No respiratory distress.     Breath sounds: Normal breath sounds.  Abdominal:     General: Abdomen is flat. Bowel sounds are normal.     Palpations: Abdomen is soft.     Tenderness: There is no abdominal tenderness.  Skin:    General: Skin is warm and dry.     Findings: No lesion.  Neurological:     General: No focal deficit present.     Mental Status: He is alert and oriented to person, place, and time. Mental status is at baseline.     (all labs ordered are listed, but only abnormal results are displayed) Labs Reviewed  RESP PANEL BY RT-PCR (RSV, FLU A&B, COVID)  [id number] - Abnormal; Notable for the following components:      Result Value   SARS Coronavirus 2 by RT PCR POSITIVE (*)    All other components within normal limits    EKG: None  Radiology: No results found.   Procedures   Medications Ordered in the ED  acetaminophen  (TYLENOL ) tablet 650 mg (650 mg Oral Given 11/10/23 1225)  Medical Decision Making Risk OTC drugs. Prescription drug management.   Debby JONETTA Blew 28 y.o. presented today for URI like symptoms. Working DDx that I considered at this time includes, but not limited to, viral illness, pharyngitis, mono, sinusitis, electrolyte abnormality, AOM.  R/o DDx: these additional diagnoses are not consistent with patient's history, presentation, physical exam, labs/imaging findings.  Labs:  Respiratory Panel: COVID positive   Problem List / ED Course / Critical interventions / Medication management  Patient reports to emergency room with complaint of flulike symptoms.  He reports nausea without vomiting.  Bodyaches, fever, congestion.  This started 2 days ago.  He is COVID-positive here.  Symptoms seem consistent with COVID.  He is hemodynamically stable and well-appearing.  Did have fever here which came down  with Tylenol .  He is not hypoxic.  Feel patient is appropriate for discharge with symptomatic management in the outpatient setting. I ordered medication including Tylenol  for fever. Reevaluation of the patient after these medicines showed that the patient improved Patients vitals assessed. Upon arrival patient is hemodynamically stable.  I have reviewed the patients home medicines and have made adjustments as needed     Plan: F/u w/ PCP in 2-3d to ensure resolution of sx.  Patient was given return precautions. Patient stable for discharge at this time.  Patient educated on sx and dx and verbalized understanding of plan. Return to ER if new or worsening sx.       Final diagnoses:  COVID    ED Discharge Orders          Ordered    acetaminophen  (TYLENOL ) 500 MG tablet  Every 8 hours PRN        11/10/23 1312    ondansetron  (ZOFRAN -ODT) 4 MG disintegrating tablet  Every 8 hours PRN        11/10/23 1312               Joao Mccurdy, Warren SAILOR, PA-C 11/10/23 1315    Randol Simmonds, MD 11/14/23 1153
